# Patient Record
Sex: Female | Born: 1940
Health system: Southern US, Community
[De-identification: ages and names within clinical notes are randomized; demographics above are authoritative.]

## PROBLEM LIST (undated history)

## (undated) DIAGNOSIS — I2510 Atherosclerotic heart disease of native coronary artery without angina pectoris: Secondary | ICD-10-CM

## (undated) DIAGNOSIS — K219 Gastro-esophageal reflux disease without esophagitis: Secondary | ICD-10-CM

## (undated) DIAGNOSIS — I10 Essential (primary) hypertension: Secondary | ICD-10-CM

## (undated) DIAGNOSIS — E7800 Pure hypercholesterolemia, unspecified: Secondary | ICD-10-CM

## (undated) DIAGNOSIS — E785 Hyperlipidemia, unspecified: Secondary | ICD-10-CM

## (undated) HISTORY — PX: NISSEN FUNDOPLICATION: SHX2091

## (undated) HISTORY — PX: CARPAL TUNNEL RELEASE: SHX101

## (undated) HISTORY — DX: Pure hypercholesterolemia, unspecified: E78.00

## (undated) HISTORY — PX: TONSILLECTOMY: SUR1361

## (undated) HISTORY — DX: Gastro-esophageal reflux disease without esophagitis: K21.9

## (undated) HISTORY — DX: Hyperlipidemia, unspecified: E78.5

## (undated) HISTORY — DX: Essential (primary) hypertension: I10

## (undated) HISTORY — PX: ESOPHAGOGASTRODUODENOSCOPY: SHX1529

## (undated) HISTORY — DX: Atherosclerotic heart disease of native coronary artery without angina pectoris: I25.10

---

## 1962-01-30 HISTORY — PX: TYMPANOPLASTY: SHX33

## 1970-01-30 HISTORY — PX: TUBAL LIGATION: SHX77

## 1977-01-30 HISTORY — PX: ABDOMINAL HYSTERECTOMY: SHX81

## 1997-07-31 ENCOUNTER — Ambulatory Visit (HOSPITAL_COMMUNITY): Admission: RE | Admit: 1997-07-31 | Discharge: 1997-07-31 | Payer: Self-pay | Admitting: Cardiology

## 1997-08-25 ENCOUNTER — Ambulatory Visit (HOSPITAL_COMMUNITY): Admission: RE | Admit: 1997-08-25 | Discharge: 1997-08-25 | Payer: Self-pay | Admitting: Gastroenterology

## 1997-09-08 ENCOUNTER — Ambulatory Visit (HOSPITAL_COMMUNITY): Admission: RE | Admit: 1997-09-08 | Discharge: 1997-09-08 | Payer: Self-pay | Admitting: Gastroenterology

## 1997-10-01 ENCOUNTER — Ambulatory Visit (HOSPITAL_COMMUNITY): Admission: RE | Admit: 1997-10-01 | Discharge: 1997-10-01 | Payer: Self-pay | Admitting: Surgery

## 1997-10-20 ENCOUNTER — Encounter: Payer: Self-pay | Admitting: Surgery

## 1997-10-22 ENCOUNTER — Encounter: Payer: Self-pay | Admitting: Gastroenterology

## 1997-10-22 ENCOUNTER — Inpatient Hospital Stay (HOSPITAL_COMMUNITY): Admission: AD | Admit: 1997-10-22 | Discharge: 1997-10-30 | Payer: Self-pay | Admitting: Surgery

## 1997-10-26 ENCOUNTER — Encounter: Payer: Self-pay | Admitting: Surgery

## 1999-04-20 ENCOUNTER — Ambulatory Visit (HOSPITAL_COMMUNITY): Admission: RE | Admit: 1999-04-20 | Discharge: 1999-04-20 | Payer: Self-pay | Admitting: Gastroenterology

## 2000-12-13 ENCOUNTER — Encounter: Payer: Self-pay | Admitting: Surgery

## 2000-12-13 ENCOUNTER — Ambulatory Visit (HOSPITAL_COMMUNITY): Admission: RE | Admit: 2000-12-13 | Discharge: 2000-12-13 | Payer: Self-pay | Admitting: Surgery

## 2001-01-04 ENCOUNTER — Ambulatory Visit (HOSPITAL_COMMUNITY): Admission: RE | Admit: 2001-01-04 | Discharge: 2001-01-04 | Payer: Self-pay | Admitting: Gastroenterology

## 2001-01-07 ENCOUNTER — Encounter: Payer: Self-pay | Admitting: Surgery

## 2001-01-09 ENCOUNTER — Inpatient Hospital Stay (HOSPITAL_COMMUNITY): Admission: RE | Admit: 2001-01-09 | Discharge: 2001-01-14 | Payer: Self-pay | Admitting: Surgery

## 2001-01-09 ENCOUNTER — Encounter: Payer: Self-pay | Admitting: Surgery

## 2001-01-11 ENCOUNTER — Encounter: Payer: Self-pay | Admitting: Surgery

## 2001-06-11 ENCOUNTER — Ambulatory Visit (HOSPITAL_COMMUNITY): Admission: RE | Admit: 2001-06-11 | Discharge: 2001-06-11 | Payer: Self-pay | Admitting: Pulmonary Disease

## 2002-03-21 ENCOUNTER — Inpatient Hospital Stay (HOSPITAL_COMMUNITY): Admission: EM | Admit: 2002-03-21 | Discharge: 2002-03-25 | Payer: Self-pay | Admitting: Emergency Medicine

## 2002-03-21 ENCOUNTER — Encounter: Payer: Self-pay | Admitting: *Deleted

## 2002-03-21 ENCOUNTER — Encounter: Payer: Self-pay | Admitting: Orthopaedic Surgery

## 2002-03-22 ENCOUNTER — Encounter: Payer: Self-pay | Admitting: Orthopaedic Surgery

## 2004-05-13 ENCOUNTER — Encounter: Admission: RE | Admit: 2004-05-13 | Discharge: 2004-05-13 | Payer: Self-pay | Admitting: Gastroenterology

## 2004-08-31 HISTORY — PX: COLONOSCOPY: SHX174

## 2004-08-31 HISTORY — PX: OTHER SURGICAL HISTORY: SHX169

## 2004-12-01 ENCOUNTER — Ambulatory Visit (HOSPITAL_COMMUNITY): Admission: RE | Admit: 2004-12-01 | Discharge: 2004-12-01 | Payer: Self-pay | Admitting: Orthopaedic Surgery

## 2005-10-30 ENCOUNTER — Encounter: Admission: RE | Admit: 2005-10-30 | Discharge: 2005-10-30 | Payer: Self-pay | Admitting: Gastroenterology

## 2005-12-05 ENCOUNTER — Emergency Department (HOSPITAL_COMMUNITY): Admission: EM | Admit: 2005-12-05 | Discharge: 2005-12-05 | Payer: Self-pay | Admitting: Emergency Medicine

## 2007-02-18 ENCOUNTER — Ambulatory Visit: Payer: Self-pay | Admitting: Family Medicine

## 2007-02-18 DIAGNOSIS — M129 Arthropathy, unspecified: Secondary | ICD-10-CM | POA: Insufficient documentation

## 2007-02-18 DIAGNOSIS — R5381 Other malaise: Secondary | ICD-10-CM | POA: Insufficient documentation

## 2007-02-18 DIAGNOSIS — K279 Peptic ulcer, site unspecified, unspecified as acute or chronic, without hemorrhage or perforation: Secondary | ICD-10-CM | POA: Insufficient documentation

## 2007-02-18 DIAGNOSIS — M5030 Other cervical disc degeneration, unspecified cervical region: Secondary | ICD-10-CM

## 2007-02-18 DIAGNOSIS — E785 Hyperlipidemia, unspecified: Secondary | ICD-10-CM

## 2007-02-18 DIAGNOSIS — K589 Irritable bowel syndrome without diarrhea: Secondary | ICD-10-CM

## 2007-02-18 DIAGNOSIS — K219 Gastro-esophageal reflux disease without esophagitis: Secondary | ICD-10-CM | POA: Insufficient documentation

## 2007-02-18 DIAGNOSIS — R5383 Other fatigue: Secondary | ICD-10-CM

## 2007-02-20 ENCOUNTER — Encounter (INDEPENDENT_AMBULATORY_CARE_PROVIDER_SITE_OTHER): Payer: Self-pay | Admitting: Family Medicine

## 2007-02-21 ENCOUNTER — Telehealth (INDEPENDENT_AMBULATORY_CARE_PROVIDER_SITE_OTHER): Payer: Self-pay | Admitting: *Deleted

## 2007-02-21 LAB — CONVERTED CEMR LAB: TSH: 2.445 microintl units/mL (ref 0.350–5.50)

## 2007-02-28 ENCOUNTER — Encounter (INDEPENDENT_AMBULATORY_CARE_PROVIDER_SITE_OTHER): Payer: Self-pay | Admitting: Family Medicine

## 2007-03-04 ENCOUNTER — Ambulatory Visit: Payer: Self-pay | Admitting: Family Medicine

## 2007-03-04 ENCOUNTER — Telehealth (INDEPENDENT_AMBULATORY_CARE_PROVIDER_SITE_OTHER): Payer: Self-pay | Admitting: *Deleted

## 2007-03-04 LAB — CONVERTED CEMR LAB
Cholesterol, target level: 200 mg/dL
HDL goal, serum: 40 mg/dL
LDL Goal: 160 mg/dL

## 2007-03-28 ENCOUNTER — Telehealth (INDEPENDENT_AMBULATORY_CARE_PROVIDER_SITE_OTHER): Payer: Self-pay | Admitting: *Deleted

## 2007-03-28 ENCOUNTER — Encounter (INDEPENDENT_AMBULATORY_CARE_PROVIDER_SITE_OTHER): Payer: Self-pay | Admitting: Family Medicine

## 2007-04-09 ENCOUNTER — Ambulatory Visit: Payer: Self-pay | Admitting: Family Medicine

## 2007-04-14 ENCOUNTER — Ambulatory Visit: Admission: RE | Admit: 2007-04-14 | Discharge: 2007-04-14 | Payer: Self-pay | Admitting: Family Medicine

## 2007-04-14 ENCOUNTER — Encounter (INDEPENDENT_AMBULATORY_CARE_PROVIDER_SITE_OTHER): Payer: Self-pay | Admitting: Family Medicine

## 2007-04-22 ENCOUNTER — Encounter (INDEPENDENT_AMBULATORY_CARE_PROVIDER_SITE_OTHER): Payer: Self-pay | Admitting: Family Medicine

## 2007-04-23 ENCOUNTER — Encounter (INDEPENDENT_AMBULATORY_CARE_PROVIDER_SITE_OTHER): Payer: Self-pay | Admitting: Family Medicine

## 2007-04-24 ENCOUNTER — Encounter (INDEPENDENT_AMBULATORY_CARE_PROVIDER_SITE_OTHER): Payer: Self-pay | Admitting: Family Medicine

## 2007-04-25 ENCOUNTER — Ambulatory Visit: Payer: Self-pay | Admitting: Pulmonary Disease

## 2007-05-07 ENCOUNTER — Telehealth (INDEPENDENT_AMBULATORY_CARE_PROVIDER_SITE_OTHER): Payer: Self-pay | Admitting: Family Medicine

## 2007-05-07 ENCOUNTER — Ambulatory Visit: Payer: Self-pay | Admitting: Family Medicine

## 2007-05-09 ENCOUNTER — Telehealth (INDEPENDENT_AMBULATORY_CARE_PROVIDER_SITE_OTHER): Payer: Self-pay | Admitting: *Deleted

## 2007-05-14 ENCOUNTER — Encounter (INDEPENDENT_AMBULATORY_CARE_PROVIDER_SITE_OTHER): Payer: Self-pay | Admitting: Family Medicine

## 2007-05-15 LAB — CONVERTED CEMR LAB
Cholesterol: 286 mg/dL — ABNORMAL HIGH (ref 0–200)
Triglycerides: 155 mg/dL — ABNORMAL HIGH (ref <150)

## 2007-05-23 ENCOUNTER — Ambulatory Visit: Payer: Self-pay | Admitting: Family Medicine

## 2007-05-24 ENCOUNTER — Encounter (INDEPENDENT_AMBULATORY_CARE_PROVIDER_SITE_OTHER): Payer: Self-pay | Admitting: Family Medicine

## 2007-05-28 ENCOUNTER — Telehealth (INDEPENDENT_AMBULATORY_CARE_PROVIDER_SITE_OTHER): Payer: Self-pay | Admitting: *Deleted

## 2007-05-31 ENCOUNTER — Telehealth (INDEPENDENT_AMBULATORY_CARE_PROVIDER_SITE_OTHER): Payer: Self-pay | Admitting: *Deleted

## 2007-06-03 ENCOUNTER — Encounter (INDEPENDENT_AMBULATORY_CARE_PROVIDER_SITE_OTHER): Payer: Self-pay | Admitting: Family Medicine

## 2007-07-04 ENCOUNTER — Ambulatory Visit: Payer: Self-pay | Admitting: Family Medicine

## 2007-07-04 DIAGNOSIS — IMO0002 Reserved for concepts with insufficient information to code with codable children: Secondary | ICD-10-CM | POA: Insufficient documentation

## 2007-07-05 LAB — CONVERTED CEMR LAB
ALT: 16 units/L (ref 0–35)
AST: 21 units/L (ref 0–37)
Alkaline Phosphatase: 72 units/L (ref 39–117)
Bilirubin, Direct: 0.1 mg/dL (ref 0.0–0.3)
Indirect Bilirubin: 0.4 mg/dL (ref 0.0–0.9)

## 2007-07-08 ENCOUNTER — Telehealth (INDEPENDENT_AMBULATORY_CARE_PROVIDER_SITE_OTHER): Payer: Self-pay | Admitting: Family Medicine

## 2007-07-09 ENCOUNTER — Telehealth (INDEPENDENT_AMBULATORY_CARE_PROVIDER_SITE_OTHER): Payer: Self-pay | Admitting: Family Medicine

## 2007-07-09 ENCOUNTER — Encounter (INDEPENDENT_AMBULATORY_CARE_PROVIDER_SITE_OTHER): Payer: Self-pay | Admitting: Family Medicine

## 2007-07-17 ENCOUNTER — Ambulatory Visit (HOSPITAL_COMMUNITY): Admission: RE | Admit: 2007-07-17 | Discharge: 2007-07-17 | Payer: Self-pay | Admitting: Family Medicine

## 2007-07-18 ENCOUNTER — Encounter (INDEPENDENT_AMBULATORY_CARE_PROVIDER_SITE_OTHER): Payer: Self-pay | Admitting: Family Medicine

## 2007-07-25 ENCOUNTER — Encounter (INDEPENDENT_AMBULATORY_CARE_PROVIDER_SITE_OTHER): Payer: Self-pay | Admitting: Family Medicine

## 2007-07-26 ENCOUNTER — Encounter (INDEPENDENT_AMBULATORY_CARE_PROVIDER_SITE_OTHER): Payer: Self-pay | Admitting: Family Medicine

## 2007-08-15 ENCOUNTER — Ambulatory Visit: Payer: Self-pay | Admitting: Family Medicine

## 2007-08-16 ENCOUNTER — Encounter (INDEPENDENT_AMBULATORY_CARE_PROVIDER_SITE_OTHER): Payer: Self-pay | Admitting: Family Medicine

## 2007-08-16 LAB — CONVERTED CEMR LAB
Albumin: 5 g/dL (ref 3.5–5.2)
Alkaline Phosphatase: 75 units/L (ref 39–117)
BUN: 10 mg/dL (ref 6–23)
Creatinine, Ser: 0.73 mg/dL (ref 0.40–1.20)
Glucose, Bld: 91 mg/dL (ref 70–99)
HDL: 54 mg/dL (ref >39)
LDL Cholesterol: 140 mg/dL — ABNORMAL HIGH (ref 0–99)
Potassium: 4.1 meq/L (ref 3.5–5.3)
Total Bilirubin: 0.5 mg/dL (ref 0.3–1.2)
Total CHOL/HDL Ratio: 4.3
Triglycerides: 195 mg/dL — ABNORMAL HIGH (ref <150)

## 2007-08-21 ENCOUNTER — Telehealth (INDEPENDENT_AMBULATORY_CARE_PROVIDER_SITE_OTHER): Payer: Self-pay | Admitting: *Deleted

## 2007-09-10 ENCOUNTER — Encounter (INDEPENDENT_AMBULATORY_CARE_PROVIDER_SITE_OTHER): Payer: Self-pay | Admitting: Family Medicine

## 2007-09-10 ENCOUNTER — Encounter (HOSPITAL_COMMUNITY): Admission: RE | Admit: 2007-09-10 | Discharge: 2007-10-10 | Payer: Self-pay | Admitting: Family Medicine

## 2007-09-26 ENCOUNTER — Ambulatory Visit: Payer: Self-pay | Admitting: Family Medicine

## 2007-10-31 ENCOUNTER — Ambulatory Visit: Payer: Self-pay | Admitting: Family Medicine

## 2007-11-07 ENCOUNTER — Telehealth (INDEPENDENT_AMBULATORY_CARE_PROVIDER_SITE_OTHER): Payer: Self-pay | Admitting: *Deleted

## 2007-11-12 ENCOUNTER — Encounter (INDEPENDENT_AMBULATORY_CARE_PROVIDER_SITE_OTHER): Payer: Self-pay | Admitting: Family Medicine

## 2007-11-13 ENCOUNTER — Encounter (INDEPENDENT_AMBULATORY_CARE_PROVIDER_SITE_OTHER): Payer: Self-pay | Admitting: Family Medicine

## 2007-11-26 ENCOUNTER — Encounter (INDEPENDENT_AMBULATORY_CARE_PROVIDER_SITE_OTHER): Payer: Self-pay | Admitting: Family Medicine

## 2007-11-27 ENCOUNTER — Encounter (INDEPENDENT_AMBULATORY_CARE_PROVIDER_SITE_OTHER): Payer: Self-pay | Admitting: Family Medicine

## 2007-12-25 ENCOUNTER — Encounter (INDEPENDENT_AMBULATORY_CARE_PROVIDER_SITE_OTHER): Payer: Self-pay | Admitting: Family Medicine

## 2007-12-30 LAB — CONVERTED CEMR LAB
ALT: 17 units/L (ref 0–35)
Alkaline Phosphatase: 78 units/L (ref 39–117)
CO2: 24 meq/L (ref 19–32)
Cholesterol: 243 mg/dL — ABNORMAL HIGH (ref 0–200)
Creatinine, Ser: 0.8 mg/dL (ref 0.40–1.20)
Total Bilirubin: 0.6 mg/dL (ref 0.3–1.2)
Total CHOL/HDL Ratio: 5.5
Triglycerides: 247 mg/dL — ABNORMAL HIGH (ref <150)
VLDL: 49 mg/dL — ABNORMAL HIGH (ref 0–40)

## 2007-12-31 ENCOUNTER — Ambulatory Visit: Payer: Self-pay | Admitting: Family Medicine

## 2008-06-04 ENCOUNTER — Ambulatory Visit (HOSPITAL_COMMUNITY): Admission: RE | Admit: 2008-06-04 | Discharge: 2008-06-04 | Payer: Self-pay | Admitting: Orthopaedic Surgery

## 2009-06-30 ENCOUNTER — Ambulatory Visit: Payer: Self-pay | Admitting: Vascular Surgery

## 2009-09-08 ENCOUNTER — Ambulatory Visit: Payer: Self-pay | Admitting: Vascular Surgery

## 2010-06-14 NOTE — Assessment & Plan Note (Signed)
OFFICE VISIT   BETSY, ROSELLO  DOB:  January 30, 1941                                       06/30/2009  XBJYN#:[id number]   CHIEF COMPLAINT:  Left knee pain.   HISTORY OF PRESENT ILLNESS:  The patient is a 70 year old female who has  had a left leg pain for approximately a year.  She apparently saw an  orthopedic doctor in [place] 1 year ago had a MRI at that time.  The  MRI is not available for review today that apparently showed a Baker's  cyst.  She stated that the physician said the cyst was small at that  time and did not need further treatment and was treated conservatively  with pain medication management.  She states that the pain continue then  became worse over time.  She states that the pain is worse when she is  sitting still.  It does not change with position and it hurts when  walking but is no different than in the sitting position.  Apparently  this cyst was aspirated by this orthopedic doctor on Jun 02, 2009.  She  developed worsening pain after the local anesthetic wore off.  She was  sent by her primary care physician for an ultrasound at [facility].  This  showed no evidence of DVT but apparently showed a pseudoaneurysm of a  branch artery near the popliteal fossa.  She states today the pain is  worse than it was a year ago.   She states that she has had some slight swelling of the left leg.  She  denies any prior trauma to the left leg.   PAST MEDICAL HISTORY:  Significant for elevated cholesterol.  This is  currently under control and followed by Dr. Barbara Cower.   PAST SURGICAL HISTORY:  She had what sounds like a hiatal hernia repair  by Dr. Wenda Low, hysterectomy, tubal ligation, tonsillectomy and an  open reduction and internal fixation of the right ankle.   FAMILY HISTORY:  Remarkable for her brother who had vascular disease at  age less than 18.   SOCIAL HISTORY:  She is married and has 3 children.  She does not smoke  or consume  alcohol regularly.   REVIEW OF SYSTEMS:  Full 12 point review of systems was performed with  the patient today.  Please see intake referral form for details  regarding this.   MEDICATIONS:  Medications include aspirin 81 mg once a day which she is  currently not taking secondary to stomach upset, simvastatin, Omega-3  fish oil, tramadol p.r.n.   ALLERGIES:  She states allergies to codeine which causes nausea,  vomiting, and itching.   PHYSICAL EXAMINATION:  Blood pressure is 156/79 in the left arm, oxygen  saturation is 98%, heart rate 66 and regular.  HEENT:  Unremarkable.  NECK:  Has 2+ carotid pulses without bruit.  CHEST:  Clear to auscultation.  CARDIAC:  Regular rate and rhythm without murmur.  ABDOMEN:  Soft, nontender, nondistended.  No masses with a well-healed  upper midline abdominal scar.  MUSCULOSKELETAL:  Exam shows no obvious major joint deformities.  She  does have a well-healed scar on the right ankle from a previous ORIF  that is nontender.  NEUROLOGIC:  Exam shows symmetric upper extremity lower extremity motor  strength which is 5/5.  SKIN:  No open  ulcers or rashes.  Extremity exam shows 2+ radial pulses bilaterally.  She has 2+ femoral,  3+ popliteal, 2+ posterior tibial and 2+ dorsalis pedis pulses  bilaterally.  There is trace edema in the left lower extremity compared  to the right.  She does have some mild tenderness on palpation in the  left popliteal fossa.  There is no obvious palpable pulsatile mass.  There is essentially no tenderness in the gastrocnemius area of the left  calf.   She had a repeat duplex ultrasound today which show what appears to be a  small pseudoaneurysm off of a side branch adjacent to the popliteal  artery but not involving any major tibial branches or popliteal  branches.  This is most likely consistent with the pseudoaneurysm seen  on Jun 07, 2009.  At that time it measured 1.7 x 1.1 cm.  It is similar  diameter today.    The duplex ultrasound today was ordered by me and I reviewed the images  real time and discussed these with the patient and interpreted the study  as well.   In summary, Ms. Obi has a small pseudoaneurysm of a branch artery most  likely arising from popliteal artery.  This is fairly small caliber,  less than 2 cm.  I do not believe that this is the origin of all of her  pain as this is a fairly small branch artery and fairly small  pseudoaneurysm.  Her pain symptoms still seem to be orthopedic in nature  primarily.  However, I did discuss with her today that a pseudoaneurysm  does not thrombose in the next month or continues to enlarge, we would  consider doing an arteriogram and possible coil embolization of this  pseudoaneurysm.  I did reassure her today that I think the risk of this  is fairly low in nature.  She is considering whether or not she wants to  see an additional orthopedic surgeon for further evaluation of her left  knee.  She will follow up with [place] in 1 month for repeat duplex  ultrasound.     Janetta Hora. Fields, MD  Electronically Signed   CEF/MEDQ  D:  06/30/2009  T:  07/01/2009  Job:  [id number]

## 2010-06-14 NOTE — Procedures (Signed)
NAME:  Tasha Peters, ROTHSCHILD NO.:  [id number]   MEDICAL RECORD NO.:  [id number]          PATIENT TYPE:  OUT   LOCATION:  SLEEP LAB                     FACILITY:  [facility]   PHYSICIAN:  Barbaraann Share, MD,FCCPDATE OF BIRTH:  1941-01-03   DATE OF STUDY:  04/14/2007                            NOCTURNAL POLYSOMNOGRAM   REFERRING PHYSICIAN:  Franchot Heidelberg, M.D.   INDICATION FOR STUDY:  Hypersomnia with sleep apnea.   EPWORTH SLEEPINESS SCORE:  2.   MEDICATIONS:   SLEEP ARCHITECTURE:  The patient had total sleep time of 385 minutes  with adequate slow wave sleep but decreased REM.  Sleep onset latency  was normal as was REM onset.  Sleep efficiency was excellent at 94%.   RESPIRATORY DATA:  The patient was found to have 1 obstructive apnea and  22 hypopneas, 4 in apnea/hypopnea index, only 4 events per hour.  The  patient was also found to have 9 RERA's for a respiratory disturbance  index of 5 events per hour.  Very loud snoring was noted throughout.  The events were not positional in nature.   OXYGEN DATA:  There were O2 desaturation as low as 87% with the  patient's obstructive events.   CARDIAC DATA:  No clinically significant arrhythmias were noted.   MOVEMENT-PARASOMNIA:  None.   IMPRESSIONS-RECOMMENDATIONS:  1. Small numbers of obstructive events which do not meet the      apnea/hypopnea index criteria for the obstructive sleep apnea      syndrome.  The patient was noted to have RERA's with a respiratory      disturbance index of 5 events per hour.  This is consistent with      very mild obstructive sleep apnea, and there was O2 desaturation as      low as 87% transiently.  Treatment for this degree of sleep apnea      should focus on weight loss alone if applicable,      upper airway surgery, oral appliance, and also CPAP.  2. No clinically significant arrhythmias or leg movements.      Barbaraann Share, MD,FCCP  Diplomate, American Board of Sleep  Medicine  Electronically Signed     [facility]/MEDQ  D:  04/25/2007 16:24:47  T:  04/26/2007 16:10:96  Job:  [id number]

## 2010-06-14 NOTE — Procedures (Signed)
VASCULAR LAB EXAM   INDICATION:  Followup pseudoaneurysm in left popliteal fossa visualized  06/30/2009.   HISTORY:  Diabetes:  No.  Cardiac:  No.  Hypertension:  No.   EXAM:  Left popliteal fossa duplex.   IMPRESSION:  1. Arteries and veins of the left popliteal fossa were imaged and show      no evidence of pseudoaneurysm or AVF.  2. Popliteal artery and vein appear within normal limits.  3. Left gastrocnemius artery and vein in popliteal fossa appear within      normal limits.   ___________________________________________  Janetta Hora Fields, MD   AS/MEDQ  D:  09/09/2009  T:  09/09/2009  Job:  [id number]

## 2010-06-14 NOTE — Assessment & Plan Note (Signed)
OFFICE VISIT   MEKIA, DIPINTO  DOB:  [birth year]                                       09/08/2009  CHART#:[id number]   The patient returns for follow-up today.  She was last seen on June 30, 2009.  She was evaluated at that time for small pseudoaneurysm seen on  MRI scan in her popliteal artery.  She denies any symptoms of  claudication or rest pain in her left leg.  Overall, she has been doing  well.  She had a knee arthroscopy performed and states that she has  gotten some relief from this but her left leg is not completely healed  yet from a pain standpoint.   PHYSICAL EXAMINATION:  Today blood pressure is 129/76 in the left arm,  heart rate 69 and regular, temperature is 98.5.  Lower extremity pulse  exam:  She has 2+ femoral, popliteal, dorsalis pedis and posterior  tibial pulses bilaterally.  There is no fullness in popliteal space.   REVIEW OF SYSTEMS:  She denies any shortness of breath or chest pain.    She had a duplex ultrasound of her popliteal artery today which shows no  evidence of pseudoaneurysm and the artery and vein were normal on exam.   I reassured the patient today that if there was a small pseudoaneurysm  in the popliteal artery it has either healed or is so small it is not of  clinical significance.  I believe the best option at this point would be  for her to follow up on an as-needed basis.  No further intervention is  required.     Janetta Hora. Fields, MD  Electronically Signed   CEF/MEDQ  D:  09/08/2009  T:  09/09/2009  Job:  [id number]   cc:   Art Buff, MD  Mila Homer. Sherlean Foot, M.D.

## 2010-06-14 NOTE — Procedures (Signed)
VASCULAR LAB EXAM   INDICATION:  Follow up incidental finding of possible pseudoaneurysm  with pain.   HISTORY:  Diabetes:  Cardiac:  Hypertension:   EXAM:  Duplex of the popliteal artery.   IMPRESSION:  Evidence of a vascularized heterogenous structure  suggestive of a possible pseudoaneurysm in an accessory branch located  in the popliteal fossa.   ___________________________________________  Janetta Hora. Fields, MD   CB/MEDQ  D:  06/30/2009  T:  06/30/2009  Job:  [id number]

## 2010-06-17 NOTE — H&P (Signed)
[facility]  Patient:    Tasha Peters, Tasha Peters Visit Number: [id number] MRN: [id number]          Service Type: SUR Location: 3W 4782 01 Attending Physician:  Katha Cabal Dictated by:   Thornton Park Daphine Deutscher, M.D. Admit Date:  01/09/2001 Discharge Date: 01/14/2001                           History and Physical  CHIEF COMPLAINT:  Gastroesophageal reflux disease after prior open Nissan fundoplication.  HISTORY OF PRESENT ILLNESS:  Tasha Peters is a 70 year old lady who underwent open Nissan fundoplication in September of 1999.  Nine months later, she fell on her porch, landing on her back and from that point forward, marked the recurrence of gastroesophageal reflux symptoms.  She has tried to manage this with medicine, but it is becoming increasingly refractory and she wanted to go ahead and have redo Nissan fundoplication.  PAST MEDICAL HISTORY:  Positive for prior cervical fusion.  CURRENT MEDICATIONS: 1. Prilosec 20 mg q.d. 2. Flexeril p.r.n. muscle spasms, neck. 3. Darvocet.  ALLERGIES:  She is allergic to CODEINE, which produces nausea and vomiting.  FAMILY HISTORY/REVIEW OF SYSTEMS:  Unremarkable and basically noncontributory.  PHYSICAL EXAMINATION:  VITAL SIGNS:  Height is 63.5 inches, weight is 153 pounds.  Temperature is 99, heart rate 66, respirations 16, and blood pressure 110/70.  HEENT:  Unremarkable.  NECK:  She has no palpable masses.  CHEST:  Clear.  HEART:  Sinus rhythm without murmurs or gallops.  ABDOMEN:  Well-healed upper midline incision without masses.  EXTREMITIES:  Full range of motion.  IMPRESSION:  Recurrent gastroesophageal reflux disease.  PLAN:  Open Nissan fundoplication. Dictated by:   Thornton Park Daphine Deutscher, M.D. Attending Physician:  Katha Cabal DD:  01/28/01 TD:  01/28/01 Job: [id number]

## 2010-06-17 NOTE — Procedures (Signed)
[place]  Patient:    Tasha Peters, Tasha Peters Visit Number: [id number] MRN: [id number]          Service Type: END Location: ENDO Attending Physician:  Orland Mustard Dictated by:   Llana Aliment. Randa Evens, M.D. Proc. Date: 01/04/01 Admit Date:  01/04/2001   CC:         Thornton Park. Daphine Deutscher, M.D.  Kari Baars, M.D.  Darden Palmer., M.D.   Procedure Report  NAME OF PROCEDURE:  Esophagogastroduodenoscopy.  ENDOSCOPIST:  Llana Aliment. Randa Evens, M.D.  MEDICATIONS:  Cetacaine spray, fentanyl 25 mcg and Versed 5 mg IV.  INDICATIONS:  [age]-year-old who had a Nissen fundoplication by Dr. Daphine Deutscher.  Did fairly well initially, but had a fall subsequent to the procedure and since then has had increasing reflux.  This is done in anticipation of a possible redo of her Nissen fundoplication.  DESCRIPTION OF PROCEDURE:  Procedure had been explained to the patient and consent obtained.  With the patient in the left lateral decubitus position the Olympus video endoscope was inserted blindly into the esophagus and advanced under direct visualization.  The stomach was entered, pylorus then passed. The duodenum including the bulb and second portion were seen well; unremarkable.  There were no masses in the duodenum.  The scope was withdrawn.  The duodenal bulb was free of ulceration.  The pyloric channel was normal.  The scope was withdrawn back into the stomach. The fundus and cardia were seen and were normal.  On retroflex view the antrum and body were normal with no ulcerations or masses.  There was a 6-8 cm hiatal hernia with a widely patent GE junction and prereflux into the chest.  The distal and proximal esophagus was endoscopically normal.  The scope was withdrawn.  The patient tolerated the procedure well and was maintained on low-flow oxygen and pulse oximeter throughout the procedure.  ASSESSMENT:  Hiatal hernia with reflux.  PLAN:   The patient will follow up with Dr. Daphine Deutscher as planned. Dictated by:   Llana Aliment. Randa Evens, M.D. Attending Physician:  Orland Mustard DD:  01/04/01 TD:  01/04/01 Job: [id number]

## 2010-06-17 NOTE — Discharge Summary (Signed)
   NAME:  Tasha Peters, Tasha Peters                          ACCOUNT NO.:  [id number]   MEDICAL RECORD NO.:  [id number]                   PATIENT TYPE:  INP   LOCATION:  A336                                 FACILITY:  [facility]   PHYSICIAN:  J. Darreld Mclean, M.D.              DATE OF BIRTH:  1940-09-17   DATE OF ADMISSION:  03/21/2002  DATE OF DISCHARGE:  03/25/2002                                 DISCHARGE SUMMARY   DISCHARGE DIAGNOSES:  1. Trimalleolar fracture of the right ankle.  2. History of reflux disease.  3. Status post fusion of neck x3.   DISPOSITION:  The patient is discharged home.  She is to follow up in the  office on May 04, 2002.  She will undergo x-rays of her right ankle.   DISCHARGE STATUS:  Improved.   PROGNOSIS:  Good.   DISCHARGE MEDICATIONS:  Darvocet-N 100 one tablet q.6h. p.r.n. for pain.   BRIEF HISTORY AND PHYSICAL:  Please refer to the typewritten History and  Physical on the patient's chart.   HOSPITAL COURSE:  The patient is a 70 year old white female who fell at home  and twisted her right ankle and sustained a trimalleolar fracture to the  right ankle with displacement.  The day after admission to this hospital she  underwent open treatment internal fixation of the right ankle fracture under  general anesthetic.  She tolerated the procedure very well.  Postoperatively  she was seen by physical therapy for gait training and nonweightbearing to  the right foot.  At the time of discharge she was able to ambulate some 70  feet with standby assistance.  Postoperatively she remained afebrile in  stable condition.   The patient was discharged home on March 25, 2002.  Home health services  provided as well as hospital equipment.   DISCHARGE INSTRUCTIONS:  1. She will continue ambulation with crutches and walker.  2. No weightbearing right foot.  3. Elevation of the right lower extremity at all times.  4. Ice pack to the ankle for pain and swelling.  5. She  is to call the office through the hospital beeper system if there is     any problem.     Candace Cruise, Doylene Bode, M.D.   BB/MEDQ  D:  04/15/2002  T:  04/15/2002  Job:  [id number]

## 2010-06-17 NOTE — Op Note (Signed)
NAME:  Tasha Peters, Tasha Peters                          ACCOUNT NO.:  [id number]   MEDICAL RECORD NO.:  [id number]                   PATIENT TYPE:  INP   LOCATION:  A336                                 FACILITY:  [facility]   PHYSICIAN:  J. Darreld Mclean, M.D.              DATE OF BIRTH:  08/04/1940   DATE OF PROCEDURE:  03/22/2002  DATE OF DISCHARGE:                                 OPERATIVE REPORT   PREOPERATIVE DIAGNOSIS:  Trimalleolar fracture of the right ankle.   POSTOPERATIVE DIAGNOSIS:  Trimalleolar fracture of the right ankle.   PROCEDURE:  Open treatment and internal fixation of right trimalleolar  fracture of the ankle.   ANESTHESIA:  General.   TOURNIQUET TIME:  27 minutes.   DRAINS:  None.   SPLINT:  A posterior splint applied.   SURGEON:  J. Darreld Mclean, M.D.   ASSISTANT:  Candace Cruise, P.A.   INDICATIONS:  The patient is a 70 year old female who slipped and fell last  night at her home and sustained a dislocation of her ankle.  The  trimalleolar fracture of the ankle was relocated in the ER.  Surgery is  scheduled today.  Risks and imponderables have been discussed with the  patient preoperatively, and she appears to understand and agree to the  procedure as outlined.  I also discussed it with her husband.   DESCRIPTION OF PROCEDURE:  The patient was given general anesthesia and laid  supine on the operating room table.  The tourniquet was placed deflated on  the right upper thigh.  The patient was prepped and draped in the usual  manner.  The leg was elevated and wrapped circumferentially with an Esmarch  bandage, the tourniquet inflated to 300 mmHg.  Esmarch bandage removed.  A  ________ incision was made medially and laterally.  Medially the incision  was made and with careful dissection the medial malleolar fracture was  identified.  On top of the talar dome there was a small little area where  you could see some bruising, but there was no obvious fracture.  The  area  was cleaned of hematoma and the fracture was anatomically reduced, held in  place with a Synthes clamp.  A 0.062 smooth Kirschner wire was placed and  then a 3.2 drill bit was used.  A 55 mm long, 4.5 malleolar screw was  inserted.  This gave an anatomical fixation of the fracture.  The Kirschner  wire was cut and bent in place.  Attention was then directed to the lateral  side.  The fracture site was identified, cleaned of hematoma, anatomically  held in place.  A 2.5 mm drill bit was used and then a 3.5 mm tap, a 20 mm  screw was inserted.  This gave a good intrafragmentary compression of the  fracture.  An x-ray was taken in [facility] and lateral and shows anatomic reduction  of all three fractures.  The wound was then reapproximated using 2-0  chromic, 2-0 plain, and skin staples.  A sterile dressing applied, a bulky  dressing applied, and a posterior splint applied.  The tourniquet deflated  after 27 minutes.  The patient tolerated the procedure well, went to  recovery in good condition.  Orders have been written.                                               Teola Bradley, M.D.    JWK/MEDQ  D:  03/22/2002  T:  03/22/2002  Job:  [id number]   cc:   Ramon Dredge L. Juanetta Gosling, M.D.  [address]  [place]  [place] [address]  Fax: [phone]

## 2010-06-17 NOTE — Procedures (Signed)
[place]  Patient:    Tasha Peters, Tasha Peters                       MRN: [id number] Proc. Date: 04/20/99 Adm. Date:  [id number] Attending:  Orland Mustard CC:         Thornton Park. Daphine Deutscher, M.D.             Darden Palmer., M.D.             Kari Baars, M.D.                           Procedure Report  PROCEDURE PERFORMED:  Esophagogastroduodenoscopy.  ENDOSCOPIST:  Llana Aliment. Randa Evens, M.D.  MEDICATIONS USED:  Hurricaine spray, fentanyl 70 mcg, Versed 7 mg IV.  INDICATIONS:  A nice 70 year old woman who had a previous antireflux operation y Dr. Daphine Deutscher and did very well until she fell.  After she fell, there was a tearing sensation and after that she began to have reflux again.  Upper GI revealed what appeared to be a sliding of the wrap.  The procedure was done to evaluate this further.  DESCRIPTION OF PROCEDURE:  The procedure had been explained to the patient and consent obtained.  With the patient in the left lateral decubitus position the Olympus video endoscope was inserted blindly into the esophagus and advanced under direct visualization. The stomach was entered and the pylorus identified and passed.  The duodenum including the bulb and second portion were seen well. The scope was withdrawn back to the stomach and gastric antrum and body were seen well and were normal.  The fundus and cardia were seen on retroflex view.  There was  obvious twisting of the mucosal folds probably due to the wrap.  The diaphragm as located at 38 cm.  Appeared to be the Z-line at 33 to 34 cm.  I suspect the wrap had slipped up into the chest.  It was somewhat difficult to tell.  The distal nd proximal esophagus were seen well on withdrawal and were basically normal.  The  patient tolerated the procedure well maintained on low-flow oxygen and pulse oximetry throughout the procedure with no obvious problem.  ASSESSMENT:  Hiatal hernia with  possible slip in the wrap with signs of esophageal reflux disease.  There is irrigation right at the GE junction.  Have the patient follow up with Dr. Daphine Deutscher to consider possible surgical options. DD:  04/20/99 TD:  04/20/99 Job: [id number]

## 2010-06-17 NOTE — H&P (Signed)
   NAME:  Tasha Peters, Tasha Peters                            ACCOUNT NO.:  [id number]   MEDICAL RECORD NO.:  [id number]                    PATIENT TYPE:   LOCATION:                                       FACILITY:   PHYSICIAN:  J. Darreld Mclean, M.D.              DATE OF BIRTH:   DATE OF ADMISSION:  DATE OF DISCHARGE:                                HISTORY & PHYSICAL   HISTORY OF PRESENT ILLNESS:  The patient is a 70 year old female who was in  her yard who slipped and fell on the wet ground and twisted her right ankle.  She has a displaced trimalleolar fracture of the right ankle. Emergency room  physician tried to reduce it but it is not reduced. It is a little better  aligned. No other injuries were sustained. No loss of consciousness. The  patient takes an occasional Aleve.   ALLERGIES:  CODEINE.   PAST SURGICAL HISTORY:  1. Three neck fusions.  2. Surgeries for reflux, both open.  3. Hysterectomy.  4. Hemorrhoidectomy.   PAST MEDICAL HISTORY:  The patient denies heart attack, CNS stroke problems.  Has had reflux but she hopes that the recent surgery has helped that. She  has chronic neck pain, helped by three fusions. Aleve takes care of most of  that pain. Denies any GU problems. Denies any previous fractures.   SOCIAL HISTORY:  The patient is married. Lives here in [place]. Her  husband accompanies her and is present here in the emergency room.   PHYSICAL EXAMINATION:  VITAL SIGNS: Within normal limits.  HEENT: Negative.  GENERAL: She is alert and cooperative.  NECK: Supple.  LUNGS: Clear.  HEART: Without murmur, rub, or gallop.  ABDOMEN: Soft, tender and without masses.  EXTREMITIES: Deformity of the right ankle.  NEURO:  Intact.  SKIN: Intact.   IMPRESSION:  Trimalleolar fracture and dislocation of right ankle.   PROCEDURE:  Performed in the emergency room. After giving the patient IV  Morphine, closed reduction was carried out and reduced to dislocation of her  ankle  with posterior splint.   PLAN:  She will need OTIF of the ankle tomorrow. I explained the risks and  the problems prior to giving Morphine to both her and her husband. This  include infection, pulmonary embolism which could lead to death, and the  need to stay off of it and elevate as well as traumatic arthritis. I have  recommended spinal anesthesia.                                                Teola Bradley, M.D.    JWK/MEDQ  D:  03/21/2002  T:  03/21/2002  Job:  [id number]

## 2010-06-17 NOTE — Discharge Summary (Signed)
[facility]  Patient:    LYNDSEE, CASA Visit Number: [id number] MRN: [id number]          Service Type: SUR Location: 3W [id number] [id number] Attending Physician:  Katha Cabal Dictated by:   Thornton Park Daphine Deutscher, M.D. Admit Date:  01/09/2001 Discharge Date: 01/14/2001                             Discharge Summary  ADMISSION DIAGNOSIS:  Recurrent reflux.  PROCEDURE:  January 09, 2001, open repair of hiatal hernia with redo Nissen fundoplication.  Repair of ventral hernia.  HOSPITAL COURSE:  This patient underwent the aforementioned operation and had a fairly benign postoperative course.  On the day following her surgery, she was slowly getting up and moving around.  Her pulse rate was up a bit and a Gastrografin swallow was obtained on December 13 which showed good wrap and no evidence of extravasation.  Over the weekend, she did well and she was taking a full liquid diet.  She was ready for discharge on January 14, 2001.  A prescription was written for Darvocet-N 100 to take for pain.  She was asked to return in five days to have her staples removed.  Her incision was bland. Condition was good.  FINAL DIAGNOSIS:  Status post repair of traumatic failure of previous Nissen fundoplication. Dictated by:   Thornton Park Daphine Deutscher, M.D. Attending Physician:  Katha Cabal DD:  01/14/01 TD:  01/14/01 Job: [id number]

## 2010-06-17 NOTE — Op Note (Signed)
[facility]  Patient:    Tasha Peters, Tasha Peters Visit Number: [id number] MRN: [id number]          Service Type: Attending:  Thornton Park. Daphine Deutscher, M.D. Dictated by:   Thornton Park Daphine Deutscher, M.D. Proc. Date: 01/09/01   CC:         Kari Baars, M.D.  James L. Randa Evens, M.D.   Operative Report  PREOPERATIVE DIAGNOSIS:  Traumatic failure of Nissen fundoplication.  POSTOPERATIVE DIAGNOSIS:  Recurrent hiatal hernia with Nissen fundoplication in chest.  OPERATION/PROCEDURE:  Laparotomy and take down of previous Nissen fundoplication and herniated wrap, repair of hiatal hernia, redo Nissen fundoplication, repair of ventral hernia.  SURGEON:  Thornton Park. Daphine Deutscher, M.D.  ASSISTANT:  Ardelle Park  OPERATIVE TIME:  4 hours.  DESCRIPTION OF PROCEDURE:  Tasha Peters was taken to room 1 and after prepping with Betadine and draping sterilely we first made a small longitudinal incision above the umbilicus and inserted the Hasson cannula.  With the angle scope I found her upper midline was totally socked in with adhesions and I would be unable to do anything with the laparoscope.  I then opened up the upper midline incision and entered the abdomen without difficulty.  I took down many adhesions to the anterior abdominal wall. This part of the operation went fairly rapidly.  We then began mobilizing the left lateral segment of the liver; and after doing this, we concentrated on trying to free up the wrap.  The wrap and the titanium clips on the sutures were fused to the diaphragm where it subsequently was found that the wrap was up above the diaphragm.  This was the tedious part of the dissection and took about an hour to completely free up the previously wrapped stomach and then identify the upper esophagus.  We were, however, able to do that.  We identified the posterior vagus and it was intact.  The anterior vagus was not disturbed and was felt to be intact.  I  then set about taking down the wrap itself and this was somewhat tedious. The patient had a previous right lateral perforation from a Maloney dilator and I could still see the tiny little Prolene sutures used to close this.  The closure was intact and we did not create any enterotomies.  We just took our time and then prevailed in this tedious dissection.  I then was able to get the entire wrap back around on the left side.  A Penrose drain had been placed and this was used to facilitate that.  Next, with the upper esophagogastric junction having been completely dissected, we then repaired the hiatus using 3 sutures of #1 Ethibond.  These were placed and this significantly closed the diaphragm posteriorly.  With an NG tube in the stomach, I then did not want to pass another dilator down because of the previous perforation.  I went up high on the esophagus in the abdomen and placed four sutures through the wrap portion of the stomach, the esophagus and the left anterior portion to completely invaginate the distal esophagus.  There was plenty of room underneath it so I think it was a floppy Nissen and these were tied down approximating the tissue nicely.  When completed, this area was irrigated.  There was no bleeding noted.  The liver was then allowed to return to its anatomic position.  It had been retracted using the [facility] retractor.  Next, we took out the hernia sac in the previous midline incision.  We irrigated that with saline.  We then closed the fascia with running #1 Prolene from above and below with interrupted #1 Novofils.  This created a nice closure of the fascia.  The skin was then closed with the stapler.  The patient seemed to tolerate this procedure well.  She was sent to the intensive care unit for observation postoperatively. Dictated by:   Thornton Park Daphine Deutscher, M.D. Attending:  Thornton Park. Daphine Deutscher, M.D. DD:  01/09/01 TD:  01/10/01 Job: [id number]

## 2010-12-21 ENCOUNTER — Other Ambulatory Visit: Payer: Self-pay | Admitting: Gastroenterology

## 2010-12-27 ENCOUNTER — Ambulatory Visit
Admission: RE | Admit: 2010-12-27 | Discharge: 2010-12-27 | Disposition: A | Discharge status: Home / Self Care | Payer: Medicare Other | Source: Ambulatory Visit | Attending: Gastroenterology | Admitting: Gastroenterology

## 2010-12-27 MED ORDER — IOHEXOL 300 MG/ML  SOLN
100.0000 mL | Freq: Once | INTRAMUSCULAR | Status: AC | PRN
  Administered 2010-12-27: 100 mL via INTRAVENOUS

## 2011-06-05 ENCOUNTER — Other Ambulatory Visit: Payer: Self-pay | Admitting: Gastroenterology

## 2011-06-06 ENCOUNTER — Encounter (INDEPENDENT_AMBULATORY_CARE_PROVIDER_SITE_OTHER): Payer: Self-pay | Admitting: Surgery

## 2011-06-06 ENCOUNTER — Ambulatory Visit
Admission: RE | Admit: 2011-06-06 | Discharge: 2011-06-06 | Disposition: A | Discharge status: Home / Self Care | Payer: Medicare Other | Source: Ambulatory Visit | Attending: Gastroenterology | Admitting: Gastroenterology

## 2012-05-26 ENCOUNTER — Emergency Department (HOSPITAL_COMMUNITY)
Admission: EM | Admit: 2012-05-26 | Discharge: 2012-05-26 | Disposition: A | Discharge status: Home / Self Care | Payer: Medicare Other | Attending: Emergency Medicine | Admitting: Emergency Medicine

## 2012-05-26 ENCOUNTER — Emergency Department (HOSPITAL_COMMUNITY): Payer: Medicare Other

## 2012-05-26 ENCOUNTER — Encounter (HOSPITAL_COMMUNITY): Payer: Self-pay | Admitting: *Deleted

## 2012-05-26 DIAGNOSIS — M545 Low back pain, unspecified: Secondary | ICD-10-CM | POA: Insufficient documentation

## 2012-05-26 DIAGNOSIS — R10A0 Flank pain, unspecified side: Secondary | ICD-10-CM

## 2012-05-26 DIAGNOSIS — Z7982 Long term (current) use of aspirin: Secondary | ICD-10-CM | POA: Insufficient documentation

## 2012-05-26 DIAGNOSIS — R109 Unspecified abdominal pain: Secondary | ICD-10-CM

## 2012-05-26 DIAGNOSIS — K219 Gastro-esophageal reflux disease without esophagitis: Secondary | ICD-10-CM | POA: Insufficient documentation

## 2012-05-26 DIAGNOSIS — Z79899 Other long term (current) drug therapy: Secondary | ICD-10-CM | POA: Insufficient documentation

## 2012-05-26 DIAGNOSIS — R319 Hematuria, unspecified: Secondary | ICD-10-CM

## 2012-05-26 DIAGNOSIS — E7800 Pure hypercholesterolemia, unspecified: Secondary | ICD-10-CM | POA: Insufficient documentation

## 2012-05-26 DIAGNOSIS — I10 Essential (primary) hypertension: Secondary | ICD-10-CM | POA: Insufficient documentation

## 2012-05-26 DIAGNOSIS — R630 Anorexia: Secondary | ICD-10-CM | POA: Insufficient documentation

## 2012-05-26 LAB — CBC WITH DIFFERENTIAL/PLATELET
Basophils Absolute: 0.1 10*3/uL (ref 0.0–0.1)
Eosinophils Absolute: 0.4 10*3/uL (ref 0.0–0.7)
Eosinophils Relative: 6 % — ABNORMAL HIGH (ref 0–5)
MCH: 31.1 pg (ref 26.0–34.0)
MCHC: 35.1 g/dL (ref 30.0–36.0)
MCV: 88.7 fL (ref 78.0–100.0)
Platelets: 256 10*3/uL (ref 150–400)
RDW: 12.8 % (ref 11.5–15.5)

## 2012-05-26 LAB — BASIC METABOLIC PANEL
Calcium: 9.3 mg/dL (ref 8.4–10.5)
GFR calc non Af Amer: 86 mL/min — ABNORMAL LOW (ref >90)
Glucose, Bld: 116 mg/dL — ABNORMAL HIGH (ref 70–99)
Sodium: 141 mEq/L (ref 135–145)

## 2012-05-26 LAB — URINALYSIS, ROUTINE W REFLEX MICROSCOPIC
Bilirubin Urine: NEGATIVE
Protein, ur: NEGATIVE mg/dL
Urobilinogen, UA: 0.2 mg/dL (ref 0.0–1.0)

## 2012-05-26 LAB — URINE MICROSCOPIC-ADD ON

## 2012-05-26 MED ORDER — IOHEXOL 300 MG/ML  SOLN
50.0000 mL | Freq: Once | INTRAMUSCULAR | Status: AC | PRN
  Administered 2012-05-26: 50 mL via ORAL

## 2012-05-26 MED ORDER — ONDANSETRON HCL 4 MG/2ML IJ SOLN
4.0000 mg | Freq: Once | INTRAMUSCULAR | Status: AC
  Administered 2012-05-26: 4 mg via INTRAVENOUS
  Filled 2012-05-26: qty 2

## 2012-05-26 MED ORDER — IOHEXOL 300 MG/ML  SOLN
100.0000 mL | Freq: Once | INTRAMUSCULAR | Status: AC | PRN
  Administered 2012-05-26: 100 mL via INTRAVENOUS

## 2012-05-26 MED ORDER — SODIUM CHLORIDE 0.9 % IV BOLUS (SEPSIS)
250.0000 mL | Freq: Once | INTRAVENOUS | Status: AC
  Administered 2012-05-26: 250 mL via INTRAVENOUS

## 2012-05-26 MED ORDER — SODIUM CHLORIDE 0.9 % IV SOLN: INTRAVENOUS | Status: DC

## 2012-05-26 NOTE — ED Provider Notes (Signed)
History  This chart was scribed for Tasha Jakes, MD by Ardelia Mems, ED Scribe. This patient was seen in room APA19/APA19 and the patient's care was started at 7:14 AM.    CSN: [id number]  Arrival date & time 05/26/12  0706     Chief Complaint  Patient presents with  . Abdominal Pain    Patient is a 72 y.o. female presenting with abdominal pain. The history is provided by the patient. No language interpreter was used.  Abdominal Pain Pain location:  LLQ and RLQ Pain radiates to:  Back Pain severity:  Moderate Duration:  4 days Timing:  Constant Progression:  Unchanged Chronicity:  New Relieved by:  Nothing Ineffective treatments:  None tried Associated symptoms: hematuria   Associated symptoms: no chest pain, no chills, no cough, no diarrhea, no fever, no nausea, no shortness of breath and no vomiting    HPI Comments: TIAHNA CURE is a 72 y.o. female with a h/o HTN who presents to the Emergency Department complaining of bilateral lower back pain that radiates to LLQ and RLQ of abdomen and hematuria onset 4 days ago. Pain is constant and moderate in severity. Pt states that she has noticed blood on her tissue paper when she wipes after urinating. She believes the source of blood is urine and not vaginal or rectal. There is an associated decrease in appetite. Pt denies fever, nausea,  vomiting or any other symptoms. Pt states that she lives alone.  PCP- DR. Dondiego   Past Medical History  Diagnosis Date  . Acid reflux   . Hypertension   . Hypercholesterolemia     Past Surgical History  Procedure Laterality Date  . Tubal ligation  1972  . Abdominal hysterectomy  1979  . Tonsillectomy    . Tympanoplasty  1964  . Colonoscopy  08/31/2004  . Nissen fundoplication  01/09/2001 & 10/21/1997  . Esophagogastroduodenoscopy  01/04/2001, 04/20/1999, 08/25/1997  . Carpal tunnel release  03/2010, 03/2010    both wrists    No family history on file.  History  Substance Use  Topics  . Smoking status: Never Smoker   . Smokeless tobacco: Not on file  . Alcohol Use: No    OB History   Grav Para Term Preterm Abortions TAB SAB Ect Mult Living                  Review of Systems  Constitutional: Positive for appetite change. Negative for fever and chills.  HENT: Negative for congestion and rhinorrhea.   Eyes: Negative for visual disturbance.  Respiratory: Negative for cough and shortness of breath.   Cardiovascular: Negative for chest pain and leg swelling.  Gastrointestinal: Positive for abdominal pain. Negative for nausea, vomiting and diarrhea.  Genitourinary: Positive for hematuria.  Musculoskeletal: Positive for back pain.  Skin: Negative for rash.  Neurological: Negative for light-headedness and headaches.  Hematological: Does not bruise/bleed easily.  Psychiatric/Behavioral: Negative for confusion.    Allergies  Aspirin; Lactose intolerance (gi); and Codeine  Home Medications   Current Outpatient Rx  Name  Route  Sig  Dispense  Refill  . aspirin 81 MG tablet   Oral   Take 81 mg by mouth daily.         . clonazePAM (KLONOPIN) 2 MG tablet   Oral   Take 2 mg by mouth at bedtime.         . hyoscyamine (LEVSIN SL) 0.125 MG SL tablet   Sublingual   Place 0.125 mg  under the tongue every 6 (six) hours as needed for cramping.         . Lactase (LACTAID PO)   Oral   Take 2 tablets by mouth daily as needed.          . Melatonin 3 MG TABS   Oral   Take 1 tablet by mouth at bedtime.         Marland Kitchen omeprazole (PRILOSEC) 20 MG capsule   Oral   Take 20 mg by mouth 2 (two) times daily.         Marland Kitchen PARoxetine (PAXIL) 20 MG tablet   Oral   Take 20 mg by mouth every morning.         . simvastatin (ZOCOR) 40 MG tablet   Oral   Take 40 mg by mouth every evening.         Marland Kitchen lisinopril-hydrochlorothiazide (PRINZIDE,ZESTORETIC) 20-12.5 MG per tablet   Oral   Take 1 tablet by mouth daily.           Triage VItals: P 140/100  Pulse  90  Temp(Src) 97.3 F (36.3 C) (Oral)  Resp 20  Ht 5\' 3"  (1.6 m)  Wt 150 lb (68.04 kg)  BMI 26.58 kg/m2  SpO2 96%  Physical Exam  Constitutional: She is oriented to person, place, and time. She appears well-developed and well-nourished.  HENT:  Head: Normocephalic and atraumatic.  Eyes: Conjunctivae and EOM are normal. Pupils are equal, round, and reactive to light.  Neck: Normal range of motion. Neck supple.  Cardiovascular: Normal rate, regular rhythm and normal heart sounds.   No murmur heard. Pulmonary/Chest: Effort normal and breath sounds normal. No respiratory distress.  Abdominal: Soft. Bowel sounds are normal. There is no tenderness.  Musculoskeletal: Normal range of motion. She exhibits no edema.  No lower extremity edema. Contracted right fifth finger due to past ligament injury.  Neurological: She is alert and oriented to person, place, and time. No cranial nerve deficit.  Skin: Skin is warm and dry. No rash noted.    ED Course  Procedures (including critical care time)  DIAGNOSTIC STUDIES: Oxygen Saturation is 96% on RA, normal by my interpretation.    COORDINATION OF CARE: 7:30 AM- Pt advised of plan for treatment and pt agrees.   Results for orders placed during the hospital encounter of 05/26/12  URINALYSIS, ROUTINE W REFLEX MICROSCOPIC      Result Value Range   Color, Urine YELLOW  YELLOW   APPearance CLEAR  CLEAR   Specific Gravity, Urine 1.020  1.005 - 1.030   pH 6.5  5.0 - 8.0   Glucose, UA NEGATIVE  NEGATIVE mg/dL   Hgb urine dipstick TRACE (*) NEGATIVE   Bilirubin Urine NEGATIVE  NEGATIVE   Ketones, ur NEGATIVE  NEGATIVE mg/dL   Protein, ur NEGATIVE  NEGATIVE mg/dL   Urobilinogen, UA 0.2  0.0 - 1.0 mg/dL   Nitrite NEGATIVE  NEGATIVE   Leukocytes, UA NEGATIVE  NEGATIVE  CBC WITH DIFFERENTIAL      Result Value Range   WBC 5.8  4.0 - 10.5 K/uL   RBC 4.34  3.87 - 5.11 MIL/uL   Hemoglobin 13.5  12.0 - 15.0 g/dL   HCT 16.1  09.6 - 04.5 %   MCV  88.7  78.0 - 100.0 fL   MCH 31.1  26.0 - 34.0 pg   MCHC 35.1  30.0 - 36.0 g/dL   RDW 40.9  81.1 - 91.4 %   Platelets 256  150 -  400 K/uL   Neutrophils Relative 51  43 - 77 %   Neutro Abs 2.9  1.7 - 7.7 K/uL   Lymphocytes Relative 33  12 - 46 %   Lymphs Abs 1.9  0.7 - 4.0 K/uL   Monocytes Relative 9  3 - 12 %   Monocytes Absolute 0.5  0.1 - 1.0 K/uL   Eosinophils Relative 6 (*) 0 - 5 %   Eosinophils Absolute 0.4  0.0 - 0.7 K/uL   Basophils Relative 1  0 - 1 %   Basophils Absolute 0.1  0.0 - 0.1 K/uL  BASIC METABOLIC PANEL      Result Value Range   Sodium 141  135 - 145 mEq/L   Potassium 3.7  3.5 - 5.1 mEq/L   Chloride 106  96 - 112 mEq/L   CO2 24  19 - 32 mEq/L   Glucose, Bld 116 (*) 70 - 99 mg/dL   BUN 14  6 - 23 mg/dL   Creatinine, Ser 1.61  0.50 - 1.10 mg/dL   Calcium 9.3  8.4 - 09.6 mg/dL   GFR calc non Af Amer 86 (*) >90 mL/min   GFR calc Af Amer >90  >90 mL/min  URINE MICROSCOPIC-ADD ON      Result Value Range   Squamous Epithelial / LPF FEW (*) RARE   WBC, UA 0-2  <3 WBC/hpf   RBC / HPF 0-2  <3 RBC/hpf   Bacteria, UA RARE  RARE    Ct Abdomen Pelvis W Contrast  05/26/2012  *RADIOLOGY REPORT*  Clinical Data: 72 year old female with abdominal, flank and pelvic pain with hematuria.  CT ABDOMEN AND PELVIS WITH CONTRAST  Technique:  Multidetector CT imaging of the abdomen and pelvis was performed following the standard protocol during bolus administration of intravenous contrast.  Contrast: 100 ml intravenous Omnipaque-300 12/27/2010 CT  Comparison: 12/27/2010 CT  Findings: Mild fatty infiltration of the liver is noted without focal hepatic abnormalities. The spleen, pancreas, adrenal glands, gallbladder and kidneys are unremarkable. There is no evidence of renal mass or urinary calculi.  No free fluid, enlarged lymph nodes, biliary dilation or abdominal aortic aneurysm identified.  The bowel and bladder are unremarkable. Moderate supraumbilical ventral hernias containing fat are  unchanged. No acute or suspicious bony abnormalities are identified.  IMPRESSION: No evidence of acute abnormality.  Unchanged mild fatty infiltration of the liver and moderate supraumbilical ventral hernias containing fat.   Original Report Authenticated By: Harmon Pier, M.D.      1. Flank pain   2. Hematuria       MDM  No explanation for the patient's hematuria. No hematuria here today. Questionable blood from the vaginal area patient has a GYN Dr. what they can followup with. No leukocytosis no anemia. No renal function abnormality. CT scan completely negative. Patient comfortable in emergency part nontoxic no acute distress.        I personally performed the services described in this documentation, which was scribed in my presence. The recorded information has been reviewed and is accurate.     Tasha Jakes, MD 05/26/12 1130

## 2012-05-26 NOTE — ED Notes (Signed)
Pt c/o lower back and lower abd pain, worse on right vs left side, that started Tuesday, pt also has been noticing blood when she will wipe after urination.

## 2012-05-26 NOTE — ED Notes (Signed)
MD at bedside. 

## 2015-03-25 ENCOUNTER — Other Ambulatory Visit: Payer: Self-pay | Admitting: Orthopaedic Surgery

## 2015-03-25 ENCOUNTER — Telehealth: Payer: Self-pay | Admitting: *Deleted

## 2015-03-25 ENCOUNTER — Ambulatory Visit (INDEPENDENT_AMBULATORY_CARE_PROVIDER_SITE_OTHER): Payer: Medicare Other

## 2015-03-25 ENCOUNTER — Encounter: Payer: Self-pay | Admitting: Orthopaedic Surgery

## 2015-03-25 ENCOUNTER — Ambulatory Visit (INDEPENDENT_AMBULATORY_CARE_PROVIDER_SITE_OTHER): Payer: Medicare Other | Admitting: Orthopaedic Surgery

## 2015-03-25 VITALS — BP 157/82 | HR 74 | Temp 97.7°F | Resp 16 | Ht 63.0 in | Wt 144.0 lb

## 2015-03-25 DIAGNOSIS — M10071 Idiopathic gout, right ankle and foot: Secondary | ICD-10-CM | POA: Diagnosis not present

## 2015-03-25 DIAGNOSIS — M25571 Pain in right ankle and joints of right foot: Secondary | ICD-10-CM | POA: Diagnosis not present

## 2015-03-25 LAB — URIC ACID: Uric Acid, Serum: 6.1 mg/dL (ref 2.4–7.0)

## 2015-03-25 MED ORDER — DICLOFENAC SODIUM 3 % TD GEL: TRANSDERMAL | Status: DC

## 2015-03-25 NOTE — Telephone Encounter (Signed)
Patient called stating on her paper work her medication was called into [facility] in [place], Patient said it needs to be called into [facility] on [address] in [place]. Please advise

## 2015-03-25 NOTE — Progress Notes (Addendum)
Patient [name]:[id number] C [place], female DOB:30-Nov-1940, 75 y.o. [id number]  Chief Complaint  Patient presents with  . Ankle Pain    Right ankle pain    HPI  Tasha Peters is a 75 y.o. female who had sudden onset of pain in the right ankle about three weeks ago with no history of trauma or unusual activity.  She has no fever, no redness.  She has seen her family doctor about his.  He asked she come here.  She had a trimalleolar fracture in 2004 and had surgery by me then.  She has done well with the ankle until now.  She has deep pain at times. It is worse with weight bearing.  She has not had relief with Advil or ice.  Elevating it helps.  Her mother had gout and I will get uric acid level done.  HPI  Body mass index is 25.51 kg/(m^2). Marland Kitchen  Review of Systems  Constitutional: Positive for fatigue.       Patient does not have Diabetes Mellitus. Patient has hypertension. Patient does not have COPD or shortness of breath. Patient does not have BMI > 35. Patient does not have current smoking history.  HENT: Negative for congestion.   Respiratory: Negative for cough and shortness of breath.   Cardiovascular: Negative for chest pain.  Endocrine: Negative for cold intolerance.  Musculoskeletal: Positive for myalgias, joint swelling, arthralgias and gait problem.  Allergic/Immunologic: Negative for environmental allergies.    Past Medical History  Diagnosis Date  . Acid reflux   . Hypertension   . Hypercholesterolemia     Past Surgical History  Procedure Laterality Date  . Tubal ligation  1972  . Abdominal hysterectomy  1979  . Tonsillectomy    . Tympanoplasty  1964  . Colonoscopy  08/31/2004  . Nissen fundoplication  [id number] & 10/21/1997  . Esophagogastroduodenoscopy  01/04/2001, 04/20/1999, 08/25/1997  . Carpal tunnel release  03/2010, 03/2010    both wrists    No family history on file.  Social History Social History  Substance Use Topics  . Smoking status: Never Smoker    . Smokeless tobacco: None  . Alcohol Use: No    Allergies  Allergen Reactions  . Lactose Intolerance (Gi)   . Codeine Itching and Nausea And Vomiting    Current Outpatient Prescriptions  Medication Sig Dispense Refill  . aspirin 81 MG tablet Take 81 mg by mouth daily.    Marland Kitchen lisinopril (PRINIVIL,ZESTRIL) 10 MG tablet Take 10 mg by mouth daily.    Marland Kitchen omeprazole (PRILOSEC) 20 MG capsule Take 20 mg by mouth 2 (two) times daily.    . simvastatin (ZOCOR) 40 MG tablet Take 40 mg by mouth every evening.    . Diclofenac Sodium 3 % GEL Rub into site of pain tid.  4 gram dose. 5 Tube 5   No current facility-administered medications for this visit.     Physical Exam  Blood pressure 157/82, pulse 74, temperature 97.7 F (36.5 C), resp. rate 16, height 5\' 3"  (1.6 m), weight 144 lb (65.318 kg).  Constitutional: overall normal hygiene, normal nutrition, well developed, normal grooming, normal body habitus. Assistive device:none  Musculoskeletal: gait and station Limp right, muscle tone and strength are normal, no tremors or atrophy is present.  .  Neurological: coordination overall normal.  Deep tendon reflex/nerve stretch intact.  Sensation normal.  Cranial nerves II-XII intact.   Skin:   normal overall but well healed ankle medial and lateral scars, lesions, ulcers or rashes.  No psoriasis.  Psychiatric: Alert and oriented x 3.  Recent memory intact, remote memory unclear.  Normal mood and affect. Well groomed.  Good eye contact.  Cardiovascular: overall no swelling, no varicosities, no edema bilaterally, normal temperatures of the legs and arms, no clubbing, cyanosis and good capillary refill.  Lymphatic: palpation is normal.   Extremities:The right ankle has lateral pain and swelling.  There is pain over the anterior talofibular ligament.  No redness is present.  Knees are bilaterally normal.  The left ankle has full ROM.  Well healed medial and lateral malleolar scars are present right  ankle Inspection as above Strength and tone normal both lower extremties Range of motion full of both ankles and knees.  The right ankle is tender in ROM however.  Additional services performed: x-rays of the right ankle.  She was fitted for ankle brace for the right ankle.  Instructions for contrast baths given.  She has elevated blood pressure today.  She will see her family doctor about this.  I have ordered a serum uric acid as she has a family history of gout (her mother).  With the sudden onset I feel this should be checked.   The patient has been educated about the nature of the problem(s) and counseled on treatment options.  The patient appeared to understand what I have discussed and is in agreement with it. Encounter Diagnoses  Name Primary?  . Right ankle pain Yes  . Acute idiopathic gout of right ankle     PLAN Call if any problems.  Precautions discussed.  Continue current medications.   Return to clinic 2 wks

## 2015-03-25 NOTE — Patient Instructions (Signed)
Wear brace Use cream Get labs drawn

## 2015-03-25 NOTE — Telephone Encounter (Signed)
Give verbal over the phone to [facility] in [place] for diclofenac gel as ordered per Dr. Luna Glasgow

## 2015-04-08 ENCOUNTER — Ambulatory Visit (INDEPENDENT_AMBULATORY_CARE_PROVIDER_SITE_OTHER): Payer: Medicare Other | Admitting: Orthopaedic Surgery

## 2015-04-08 ENCOUNTER — Telehealth: Payer: Self-pay | Admitting: Orthopaedic Surgery

## 2015-04-08 ENCOUNTER — Ambulatory Visit (INDEPENDENT_AMBULATORY_CARE_PROVIDER_SITE_OTHER): Payer: Medicare Other

## 2015-04-08 VITALS — BP 154/91 | HR 78 | Temp 97.7°F | Ht 63.0 in | Wt 144.0 lb

## 2015-04-08 DIAGNOSIS — M25571 Pain in right ankle and joints of right foot: Secondary | ICD-10-CM

## 2015-04-08 MED ORDER — ALLOPURINOL 300 MG PO TABS: 300.0000 mg | ORAL_TABLET | Freq: Every day | ORAL | Status: DC

## 2015-04-08 NOTE — Telephone Encounter (Signed)
Patient states that her medications need to be sent to [facility] in [place] on [address]. She stated that Dr. Luna Glasgow was going to send her Gout medicine into the pharmacy.

## 2015-04-08 NOTE — Progress Notes (Signed)
Patient Tasha Peters:[id number] C [place], female DOB:Jul 07, 1940, 75 y.o. [id number]  Chief Complaint  Patient presents with  . Follow-up    Right ankle    HPI  Tasha Peters is a 75 y.o. female who has had right ankle pain.  She had uric acid done which was 6.1.  Although this is in normal range, her mother had gout and I feel she has gout as well.  I will give sample of Uloric and begin allopurinol.  She has less swelling of the ankle after using the ankle brace and the cream rub.  She has no redness today.  She is walking better but still has some swelling at times.  HPI  Body mass index is 25.51 kg/(m^2).   Review of Systems  Constitutional: Positive for fatigue.       Patient does not have Diabetes Mellitus. Patient has hypertension. Patient does not have COPD or shortness of breath. Patient does not have BMI > 35. Patient does not have current smoking history.  Musculoskeletal: Positive for myalgias, joint swelling, arthralgias and gait problem.    Past Medical History  Diagnosis Date  . Acid reflux   . Hypertension   . Hypercholesterolemia     Past Surgical History  Procedure Laterality Date  . Tubal ligation  1972  . Abdominal hysterectomy  1979  . Tonsillectomy    . Tympanoplasty  1964  . Colonoscopy  08/31/2004  . Nissen fundoplication  [id number] & 10/21/1997  . Esophagogastroduodenoscopy  01/04/2001, 04/20/1999, 08/25/1997  . Carpal tunnel release  03/2010, 03/2010    both wrists    No family history on file.  Social History Social History  Substance Use Topics  . Smoking status: Never Smoker   . Smokeless tobacco: Not on file  . Alcohol Use: No    Allergies  Allergen Reactions  . Lactose Intolerance (Gi)   . Codeine Itching and Nausea And Vomiting    Current Outpatient Prescriptions  Medication Sig Dispense Refill  . aspirin 81 MG tablet Take 81 mg by mouth daily.    . Diclofenac Sodium 3 % GEL Rub into site of pain tid.  4 gram dose. 5 Tube 5  . lisinopril  (PRINIVIL,ZESTRIL) 10 MG tablet Take 10 mg by mouth daily.    Marland Kitchen omeprazole (PRILOSEC) 20 MG capsule Take 20 mg by mouth 2 (two) times daily.    . simvastatin (ZOCOR) 40 MG tablet Take 40 mg by mouth every evening.    Marland Kitchen allopurinol (ZYLOPRIM) 300 MG tablet Take 1 tablet (300 mg total) by mouth daily. 30 tablet 5   No current facility-administered medications for this visit.     Physical Exam  Blood pressure 154/91, pulse 78, temperature 97.7 F (36.5 C), height 5\' 3"  (1.6 m), weight 144 lb (65.318 kg).  Constitutional: overall normal hygiene, normal nutrition, well developed, normal grooming, normal body habitus. Assistive device:braces  Musculoskeletal: gait and station Limp slight to right, muscle tone and strength are normal, no tremors or atrophy is present.  .  Neurological: coordination overall normal.  Deep tendon reflex/nerve stretch intact.  Sensation normal.  Cranial nerves II-XII intact.   Skin:   normal overall no scars, lesions, ulcers or rashes. No psoriasis.  Psychiatric: Alert and oriented x 3.  Recent memory intact, remote memory unclear.  Normal mood and affect. Well groomed.  Good eye contact.  Cardiovascular: overall no swelling, no varicosities, no edema bilaterally, normal temperatures of the legs and arms, no clubbing, cyanosis and good capillary refill.  Lymphatic: palpation is normal.   Extremities:the right ankle has some tenderness but no swelling and no redness.   Inspection normal Strength and tone normal Range of motion full of the right ankle but some tenderness.  Additional services performed: samples of Uloric given.  Begin allopurinol.  Rx sent.  X-rays of the right ankle were done and reported separately.  The patient has been educated about the nature of the problem(s) and counseled on treatment options.  The patient appeared to understand what I have discussed and is in agreement with it.  PLAN Call if any problems.  Precautions discussed.   Continue current medications.   Return to clinic 3 weeks.

## 2015-04-14 ENCOUNTER — Other Ambulatory Visit: Payer: Self-pay | Admitting: *Deleted

## 2015-04-14 MED ORDER — ALLOPURINOL 300 MG PO TABS: 300.0000 mg | ORAL_TABLET | Freq: Every day | ORAL | Status: DC

## 2015-04-29 ENCOUNTER — Ambulatory Visit (INDEPENDENT_AMBULATORY_CARE_PROVIDER_SITE_OTHER): Payer: Medicare Other | Admitting: Orthopaedic Surgery

## 2015-04-29 VITALS — BP 150/85 | HR 70 | Temp 97.5°F | Ht 63.0 in | Wt 144.0 lb

## 2015-04-29 DIAGNOSIS — M25571 Pain in right ankle and joints of right foot: Secondary | ICD-10-CM

## 2015-04-29 NOTE — Progress Notes (Signed)
Patient [name]:[id number] C [place], female DOB:1940-09-03, 75 y.o. HN:[id number]  Chief Complaint  Patient presents with  . Follow-up    Right ankle    HPI  Tasha Peters is a 75 y.o. female who is seen in follow-up for ankle pain on the right.  She is doing very well.  She has no pain. She has some slight lateral swelling now and then. She has stopped her ankle brace.  She is wearing normal shoes now.  She has no new trauma. She has no redness.  I will see her as needed.   HPI  Body mass index is 25.51 kg/(m^2).  Review of Systems  Constitutional: Positive for fatigue.       Patient does not have Diabetes Mellitus. Patient has hypertension. Patient does not have COPD or shortness of breath. Patient does not have BMI > 35. Patient does not have current smoking history.  HENT: Negative for congestion.   Respiratory: Negative for cough and shortness of breath.   Cardiovascular: Negative for chest pain.  Musculoskeletal: Positive for myalgias, joint swelling, arthralgias and gait problem.    Past Medical History  Diagnosis Date  . Acid reflux   . Hypertension   . Hypercholesterolemia     Past Surgical History  Procedure Laterality Date  . Tubal ligation  1972  . Abdominal hysterectomy  1979  . Tonsillectomy    . Tympanoplasty  1964  . Colonoscopy  08/31/2004  . Nissen fundoplication  [id number] & 10/21/1997  . Esophagogastroduodenoscopy  01/04/2001, 04/20/1999, 08/25/1997  . Carpal tunnel release  03/2010, 03/2010    both wrists    No family history on file.  Social History Social History  Substance Use Topics  . Smoking status: Never Smoker   . Smokeless tobacco: Not on file  . Alcohol Use: No    Allergies  Allergen Reactions  . Lactose Intolerance (Gi)   . Codeine Itching and Nausea And Vomiting    Current Outpatient Prescriptions  Medication Sig Dispense Refill  . allopurinol (ZYLOPRIM) 300 MG tablet Take 1 tablet (300 mg total) by mouth daily. 30 tablet 5  .  aspirin 81 MG tablet Take 81 mg by mouth daily.    . Diclofenac Sodium 3 % GEL Rub into site of pain tid.  4 gram dose. 5 Tube 5  . lisinopril (PRINIVIL,ZESTRIL) 10 MG tablet Take 10 mg by mouth daily.    Marland Kitchen omeprazole (PRILOSEC) 20 MG capsule Take 20 mg by mouth 2 (two) times daily.    . simvastatin (ZOCOR) 40 MG tablet Take 40 mg by mouth every evening.     No current facility-administered medications for this visit.     Physical Exam  Blood pressure 150/85, pulse 70, temperature 97.5 F (36.4 C), height 5\' 3"  (1.6 m), weight 144 lb (65.318 kg).  Constitutional: overall normal hygiene, normal nutrition, well developed, normal grooming, normal body habitus. Assistive device:none  Musculoskeletal: gait and station Limp none, muscle tone and strength are normal, no tremors or atrophy is present.  .  Neurological: coordination overall normal.  Deep tendon reflex/nerve stretch intact.  Sensation normal.  Cranial nerves II-XII intact.   Skin:   normal overall no scars, lesions, ulcers or rashes. No psoriasis.  Psychiatric: Alert and oriented x 3.  Recent memory intact, remote memory unclear.  Normal mood and affect. Well groomed.  Good eye contact.  Cardiovascular: overall no swelling, no varicosities, no edema bilaterally, normal temperatures of the legs and arms, no clubbing, cyanosis and good  capillary refill.  Lymphatic: palpation is normal.   Extremities:the right ankle is doing well with no redness, no swelling. Inspection normal right ankle Strength and tone normal Range of motion full ROM of the right ankle.  The patient has been educated about the nature of the problem(s) and counseled on treatment options.  The patient appeared to understand what I have discussed and is in agreement with it.  Encounter Diagnosis  Name Primary?  . Right ankle pain Yes    PLAN Call if any problems.  Precautions discussed.  Continue current medications.   Return to clinic  PRN

## 2016-04-10 ENCOUNTER — Emergency Department (HOSPITAL_COMMUNITY)
Admission: EM | Admit: 2016-04-10 | Discharge: 2016-04-10 | Disposition: A | Discharge status: Home / Self Care | Payer: Medicare Other | Attending: Emergency Medicine | Admitting: Emergency Medicine

## 2016-04-10 ENCOUNTER — Encounter (HOSPITAL_COMMUNITY): Payer: Self-pay | Admitting: Emergency Medicine

## 2016-04-10 DIAGNOSIS — R197 Diarrhea, unspecified: Secondary | ICD-10-CM | POA: Insufficient documentation

## 2016-04-10 DIAGNOSIS — I10 Essential (primary) hypertension: Secondary | ICD-10-CM | POA: Diagnosis not present

## 2016-04-10 DIAGNOSIS — Z7982 Long term (current) use of aspirin: Secondary | ICD-10-CM | POA: Insufficient documentation

## 2016-04-10 DIAGNOSIS — E860 Dehydration: Secondary | ICD-10-CM | POA: Insufficient documentation

## 2016-04-10 DIAGNOSIS — K648 Other hemorrhoids: Secondary | ICD-10-CM

## 2016-04-10 DIAGNOSIS — K625 Hemorrhage of anus and rectum: Secondary | ICD-10-CM | POA: Diagnosis present

## 2016-04-10 LAB — CBC
HEMATOCRIT: 36.5 % (ref 36.0–46.0)
HEMOGLOBIN: 12.3 g/dL (ref 12.0–15.0)
MCH: 29.7 pg (ref 26.0–34.0)
MCHC: 33.7 g/dL (ref 30.0–36.0)
MCV: 88.2 fL (ref 78.0–100.0)
Platelets: 262 10*3/uL (ref 150–400)
RBC: 4.14 MIL/uL (ref 3.87–5.11)
RDW: 12.7 % (ref 11.5–15.5)
WBC: 10.6 10*3/uL — ABNORMAL HIGH (ref 4.0–10.5)

## 2016-04-10 LAB — COMPREHENSIVE METABOLIC PANEL
ALBUMIN: 3.9 g/dL (ref 3.5–5.0)
ALT: 18 U/L (ref 14–54)
ANION GAP: 9 (ref 5–15)
AST: 27 U/L (ref 15–41)
Alkaline Phosphatase: 66 U/L (ref 38–126)
BILIRUBIN TOTAL: 1 mg/dL (ref 0.3–1.2)
BUN: 13 mg/dL (ref 6–20)
CALCIUM: 8.8 mg/dL — AB (ref 8.9–10.3)
CO2: 22 mmol/L (ref 22–32)
Chloride: 98 mmol/L — ABNORMAL LOW (ref 101–111)
Creatinine, Ser: 0.92 mg/dL (ref 0.44–1.00)
GFR calc Af Amer: >60 mL/min (ref >60)
GFR calc non Af Amer: >60 mL/min (ref >60)
GLUCOSE: 78 mg/dL (ref 65–99)
Potassium: 3.9 mmol/L (ref 3.5–5.1)
SODIUM: 129 mmol/L — AB (ref 135–145)
Total Protein: 6 g/dL — ABNORMAL LOW (ref 6.5–8.1)

## 2016-04-10 LAB — TYPE AND SCREEN
ABO/RH(D): O NEG
Antibody Screen: NEGATIVE

## 2016-04-10 LAB — ABO/RH: ABO/RH(D): O NEG

## 2016-04-10 MED ORDER — HYDROCORTISONE ACETATE 25 MG RE SUPP: 25.0000 mg | Freq: Two times a day (BID) | RECTAL | 0 refills | Status: DC

## 2016-04-10 MED ORDER — SODIUM CHLORIDE 0.9 % IV BOLUS (SEPSIS)
1000.0000 mL | Freq: Once | INTRAVENOUS | Status: AC
  Administered 2016-04-10: 1000 mL via INTRAVENOUS

## 2016-04-10 NOTE — ED Provider Notes (Signed)
[facility] DEPT Provider Note   CSN: [id number] Arrival date & time: 04/10/16  1022     History   Chief Complaint Chief Complaint  Patient presents with  . Rectal Bleeding    HPI Tasha Peters is a 76 y.o. female.  The history is provided by the patient.  Rectal Bleeding  Quality:  Bright red Amount:  Moderate Duration: 2 days ago but nothing yesterday and only blood spot in her underwear today. Timing:  Intermittent Chronicity:  New Context: diarrhea   Context comment:  Saturday had 10-15 episodes of diarrhea and bleeding started later in the day Similar prior episodes: no   Relieved by:  None tried Worsened by:  Nothing Ineffective treatments:  None tried Associated symptoms: no abdominal pain, no fever, no light-headedness, no loss of consciousness and no vomiting   Associated symptoms comment:  Patient states she was having abdominal pain on Saturday but denies any abdominal pain now. She has not had any diarrhea since Saturday. She is feeling generally weak and worn out but was able to eat yesterday. No fever or recent antibiotic use. Risk factors: hx of IBD   Risk factors: no anticoagulant use     Past Medical History:  Diagnosis Date  . Acid reflux   . Hypercholesterolemia   . Hypertension     Patient Active Problem List   Diagnosis Date Noted  . THORACIC/LUMBOSACRAL NEURITIS/RADICULITIS UNSPEC 07/04/2007  . HYPERLIPIDEMIA 02/18/2007  . GERD 02/18/2007  . PEPTIC ULCER DISEASE 02/18/2007  . IBS 02/18/2007  . ARTHRITIS 02/18/2007  . DEGENERATIVE DISC DISEASE, CERVICAL SPINE 02/18/2007  . MALAISE AND FATIGUE 02/18/2007    Past Surgical History:  Procedure Laterality Date  . ABDOMINAL HYSTERECTOMY  1979  . CARPAL TUNNEL RELEASE  03/2010, 03/2010   both wrists  . colonoscopy  08/31/2004  . ESOPHAGOGASTRODUODENOSCOPY  01/04/2001, 04/20/1999, 08/25/1997  . NISSEN FUNDOPLICATION  51/03/5850 & 10/21/1997  . TONSILLECTOMY    . TUBAL LIGATION  1972  .  TYMPANOPLASTY  1964    OB History    No data available       Home Medications    Prior to Admission medications   Medication Sig Start Date End Date Taking? Authorizing Provider  allopurinol (ZYLOPRIM) 300 MG tablet Take 1 tablet (300 mg total) by mouth daily. 04/14/15   Sanjuana Kava, MD  aspirin 81 MG tablet Take 81 mg by mouth daily.    Historical Provider, MD  Diclofenac Sodium 3 % GEL Rub into site of pain tid.  4 gram dose. 03/25/15   Sanjuana Kava, MD  lisinopril (PRINIVIL,ZESTRIL) 10 MG tablet Take 10 mg by mouth daily.    Historical Provider, MD  omeprazole (PRILOSEC) 20 MG capsule Take 20 mg by mouth 2 (two) times daily.    Historical Provider, MD  simvastatin (ZOCOR) 40 MG tablet Take 40 mg by mouth every evening.    Historical Provider, MD    Family History No family history on file.  Social History Social History  Substance Use Topics  . Smoking status: Never Smoker  . Smokeless tobacco: Not on file  . Alcohol use No     Allergies   Lactose intolerance (gi) and Codeine   Review of Systems Review of Systems  Constitutional: Negative for fever.  Gastrointestinal: Positive for hematochezia. Negative for abdominal pain and vomiting.  Neurological: Negative for loss of consciousness and light-headedness.  All other systems reviewed and are negative.    Physical Exam Updated Vital Signs BP 94/56  Pulse 60   Temp 98.1 F (36.7 C) (Oral)   Resp 12   SpO2 96%   Physical Exam  Constitutional: She is oriented to person, place, and time. She appears well-developed and well-nourished. No distress.  HENT:  Head: Normocephalic and atraumatic.  Mouth/Throat: Oropharynx is clear and moist.  Eyes: Conjunctivae and EOM are normal. Pupils are equal, round, and reactive to light.  Neck: Normal range of motion. Neck supple.  Cardiovascular: Normal rate, regular rhythm and intact distal pulses.   No murmur heard. Pulmonary/Chest: Effort normal and breath sounds  normal. No respiratory distress. She has no wheezes. She has no rales.  Abdominal: Soft. She exhibits no distension. There is no tenderness. There is no rebound and no guarding.  Genitourinary: Rectal exam shows external hemorrhoid and internal hemorrhoid.  Genitourinary Comments: Engorged nonbleeding external hemorrhoids. On rectal exam bright red blood on finger with normal color stool. Internal hemorrhoids suspected  Musculoskeletal: Normal range of motion. She exhibits no edema or tenderness.  Neurological: She is alert and oriented to person, place, and time.  Skin: Skin is warm and dry. No rash noted. No erythema.  Psychiatric: She has a normal mood and affect. Her behavior is normal.  Nursing note and vitals reviewed.    ED Treatments / Results  Labs (all labs ordered are listed, but only abnormal results are displayed) Labs Reviewed  COMPREHENSIVE METABOLIC PANEL - Abnormal; Notable for the following:       Result Value   Sodium 129 (*)    Chloride 98 (*)    Calcium 8.8 (*)    Total Protein 6.0 (*)    All other components within normal limits  CBC - Abnormal; Notable for the following:    WBC 10.6 (*)    All other components within normal limits  POC OCCULT BLOOD, ED  TYPE AND SCREEN    EKG  EKG Interpretation None       Radiology No results found.  Procedures Procedures (including critical care time)  Medications Ordered in ED Medications  sodium chloride 0.9 % bolus 1,000 mL (1,000 mLs Intravenous New Bag/Given 04/10/16 1300)     Initial Impression / Assessment and Plan / ED Course  I have reviewed the triage vital signs and the nursing notes.  Pertinent labs & imaging results that were available during my care of the patient were reviewed by me and considered in my medical decision making (see chart for details).     Patient presenting with rectal bleeding and diarrhea that started on Saturday. Patient had numerous episodes of diarrhea and then  started seeing bright red blood. She denies any dark blood or clot at any point in time. She has not had any further diarrhea since Saturday but this morning had some blood on her underwear that was dried. She has not had a bowel movement today. She feels generally weak and drained from having that much diarrhea but states she starting to get better. She is able to ambulate without difficulty. No nausea or vomiting. She has no abdominal pain at this time. On exam she has bright red blood on finger tip with rectal exam consistent with internal hemorrhoids. Hemoglobin is not significantly changed and BMP with mild hyponatremia but otherwise normal. Patient given IV fluids. She is feeling better. We'll discharge home to follow-up with GI if symptoms persist.  Final Clinical Impressions(s) / ED Diagnoses   Final diagnoses:  Internal hemorrhoid, bleeding  Diarrhea, unspecified type  Dehydration  New Prescriptions New Prescriptions   No medications on file     Blanchie Dessert, MD 04/10/16 1315

## 2016-04-10 NOTE — ED Triage Notes (Signed)
Pt reports bright red blood coming from rectum since Saturday, pt reports prior to bleeding she had very bad diarrhea. Pt a/ox 4, resp e/u, skin warm and dry.

## 2018-04-02 DIAGNOSIS — R32 Unspecified urinary incontinence: Secondary | ICD-10-CM | POA: Diagnosis not present

## 2018-04-02 DIAGNOSIS — R69 Illness, unspecified: Secondary | ICD-10-CM | POA: Diagnosis not present

## 2018-04-02 DIAGNOSIS — I2510 Atherosclerotic heart disease of native coronary artery without angina pectoris: Secondary | ICD-10-CM | POA: Diagnosis not present

## 2018-04-02 DIAGNOSIS — Z885 Allergy status to narcotic agent status: Secondary | ICD-10-CM | POA: Diagnosis not present

## 2018-04-02 DIAGNOSIS — K219 Gastro-esophageal reflux disease without esophagitis: Secondary | ICD-10-CM | POA: Diagnosis not present

## 2018-04-02 DIAGNOSIS — I10 Essential (primary) hypertension: Secondary | ICD-10-CM | POA: Diagnosis not present

## 2018-04-02 DIAGNOSIS — E785 Hyperlipidemia, unspecified: Secondary | ICD-10-CM | POA: Diagnosis not present

## 2018-04-02 DIAGNOSIS — Z8249 Family history of ischemic heart disease and other diseases of the circulatory system: Secondary | ICD-10-CM | POA: Diagnosis not present

## 2018-05-08 ENCOUNTER — Ambulatory Visit: Payer: Medicare Other | Admitting: Cardiovascular Disease

## 2018-06-07 DIAGNOSIS — K219 Gastro-esophageal reflux disease without esophagitis: Secondary | ICD-10-CM | POA: Diagnosis not present

## 2018-06-07 DIAGNOSIS — M545 Low back pain: Secondary | ICD-10-CM | POA: Diagnosis not present

## 2018-06-07 DIAGNOSIS — Z789 Other specified health status: Secondary | ICD-10-CM | POA: Diagnosis not present

## 2018-06-07 DIAGNOSIS — I2510 Atherosclerotic heart disease of native coronary artery without angina pectoris: Secondary | ICD-10-CM | POA: Diagnosis not present

## 2018-06-07 DIAGNOSIS — Z299 Encounter for prophylactic measures, unspecified: Secondary | ICD-10-CM | POA: Diagnosis not present

## 2018-06-07 DIAGNOSIS — Z6823 Body mass index (BMI) 23.0-23.9, adult: Secondary | ICD-10-CM | POA: Diagnosis not present

## 2018-06-07 DIAGNOSIS — M5136 Other intervertebral disc degeneration, lumbar region: Secondary | ICD-10-CM | POA: Diagnosis not present

## 2018-06-21 DIAGNOSIS — R350 Frequency of micturition: Secondary | ICD-10-CM | POA: Diagnosis not present

## 2018-06-21 DIAGNOSIS — N390 Urinary tract infection, site not specified: Secondary | ICD-10-CM | POA: Diagnosis not present

## 2018-06-21 DIAGNOSIS — Z6823 Body mass index (BMI) 23.0-23.9, adult: Secondary | ICD-10-CM | POA: Diagnosis not present

## 2018-06-21 DIAGNOSIS — I2510 Atherosclerotic heart disease of native coronary artery without angina pectoris: Secondary | ICD-10-CM | POA: Diagnosis not present

## 2018-06-21 DIAGNOSIS — Z299 Encounter for prophylactic measures, unspecified: Secondary | ICD-10-CM | POA: Diagnosis not present

## 2018-07-09 DIAGNOSIS — E2839 Other primary ovarian failure: Secondary | ICD-10-CM | POA: Diagnosis not present

## 2018-07-09 DIAGNOSIS — Z79899 Other long term (current) drug therapy: Secondary | ICD-10-CM | POA: Diagnosis not present

## 2018-07-09 DIAGNOSIS — Z1211 Encounter for screening for malignant neoplasm of colon: Secondary | ICD-10-CM | POA: Diagnosis not present

## 2018-07-09 DIAGNOSIS — Z299 Encounter for prophylactic measures, unspecified: Secondary | ICD-10-CM | POA: Diagnosis not present

## 2018-07-09 DIAGNOSIS — Z6823 Body mass index (BMI) 23.0-23.9, adult: Secondary | ICD-10-CM | POA: Diagnosis not present

## 2018-07-09 DIAGNOSIS — Z1331 Encounter for screening for depression: Secondary | ICD-10-CM | POA: Diagnosis not present

## 2018-07-09 DIAGNOSIS — Z0000 Encounter for general adult medical examination without abnormal findings: Secondary | ICD-10-CM | POA: Diagnosis not present

## 2018-07-09 DIAGNOSIS — Z1339 Encounter for screening examination for other mental health and behavioral disorders: Secondary | ICD-10-CM | POA: Diagnosis not present

## 2018-07-09 DIAGNOSIS — I2510 Atherosclerotic heart disease of native coronary artery without angina pectoris: Secondary | ICD-10-CM | POA: Diagnosis not present

## 2018-07-09 DIAGNOSIS — Z7189 Other specified counseling: Secondary | ICD-10-CM | POA: Diagnosis not present

## 2018-07-09 DIAGNOSIS — E559 Vitamin D deficiency, unspecified: Secondary | ICD-10-CM | POA: Diagnosis not present

## 2018-07-15 DIAGNOSIS — E2839 Other primary ovarian failure: Secondary | ICD-10-CM | POA: Diagnosis not present

## 2018-07-22 ENCOUNTER — Ambulatory Visit (INDEPENDENT_AMBULATORY_CARE_PROVIDER_SITE_OTHER): Payer: Medicare HMO | Admitting: Cardiovascular Disease

## 2018-07-22 ENCOUNTER — Encounter: Payer: Self-pay | Admitting: Cardiovascular Disease

## 2018-07-22 ENCOUNTER — Other Ambulatory Visit: Payer: Self-pay

## 2018-07-22 VITALS — BP 130/70 | HR 74 | Ht 63.0 in | Wt 137.0 lb

## 2018-07-22 DIAGNOSIS — I10 Essential (primary) hypertension: Secondary | ICD-10-CM

## 2018-07-22 DIAGNOSIS — I25118 Atherosclerotic heart disease of native coronary artery with other forms of angina pectoris: Secondary | ICD-10-CM | POA: Diagnosis not present

## 2018-07-22 DIAGNOSIS — E785 Hyperlipidemia, unspecified: Secondary | ICD-10-CM | POA: Diagnosis not present

## 2018-07-22 NOTE — Progress Notes (Signed)
Cardiology Consultation:   Patient ID: Tasha Peters MRN: [id number]; DOB: 1940-04-21  Admit date: (Not on file) Date of Consult: 07/22/2018  Primary Care Provider: Medicine, [facility] Primary Cardiologist: No primary care provider on file.  Primary Electrophysiologist:  None    Patient Profile:   Tasha Peters is a 78 y.o. female with a hx of CAD who is being seen today for the evaluation of CAD at the request of Dr. Tommie Sams.  History of Present Illness:   Tasha Peters is a 78 year old woman who was previously seeing a cardiologist in [place], [place].  I did an extensive review of her records.  She reportedly has chronic ischemic heart disease.  She apparently underwent a negative stress echocardiogram for inducible ischemia in November 2018.  She has a history of atypical chest pain.    Upon speaking with her, she told me she underwent coronary artery stent placement on October 27, 2015.  She got a copy of the stent card but does not know where it is.  She had her lipids checked last week by her PCP and was told her LDL is elevated.  She told me she has "several other blockages "but none that needed stents.  She tries to eat healthy and bakes at home.  She lives in an apartment by herself.  She has been widowed since 2013.  That was her second marriage.  They had been married for 16 years.  A sister died in 27-May-2022 of this year at the age of 72.  She has another sister with Alzheimer's dementia who causes her a lot of stress as she blames her for stealing money and possessions.  The patient currently denies any exertional chest pain and dyspnea.  She had chest pain on 06/17/2018 and had to take 2 nitroglycerin.  She said this was after receiving several phone calls from her sister with Alzheimer's accusing her of stealing things.  She denies orthopnea, palpitations, and paroxysmal nocturnal dyspnea.  She denies any significant leg edema.    Past Medical History:   Diagnosis Date  . Acid reflux   . Hypercholesterolemia   . Hypertension     Past Surgical History:  Procedure Laterality Date  . ABDOMINAL HYSTERECTOMY  1979  . CARPAL TUNNEL RELEASE  03/2010, 03/2010   both wrists  . colonoscopy  08/31/2004  . ESOPHAGOGASTRODUODENOSCOPY  01/04/2001, 04/20/1999, 08/25/1997  . NISSEN FUNDOPLICATION  67/61/9509 & 10/21/1997  . TONSILLECTOMY    . TUBAL LIGATION  1972  . TYMPANOPLASTY  1964     Home Medications:  Prior to Admission medications   Medication Sig Start Date End Date Taking? Authorizing Provider  aspirin 81 MG tablet Take 81 mg by mouth daily.   Yes [provider]  atorvastatin (LIPITOR) 80 MG tablet Take 80 mg by mouth daily. 03/20/16  Yes [provider]  lisinopril (PRINIVIL,ZESTRIL) 2.5 MG tablet Take 2.5 mg by mouth daily.   Yes [provider]  metoprolol (LOPRESSOR) 50 MG tablet Take 50 mg by mouth 2 (two) times daily. 03/13/16  Yes [provider]  pantoprazole (PROTONIX) 20 MG tablet Take 20 mg by mouth daily.   Yes [provider]    Inpatient Medications: Scheduled Meds:  Continuous Infusions:  PRN Meds:   Allergies:    Allergies  Allergen Reactions  . Lactose Intolerance (Gi)   . Codeine Itching and Nausea And Vomiting    Social History:   Social History   Socioeconomic History  .  Marital status: Widowed    Spouse name: Not on file  . Number of children: Not on file  . Years of education: Not on file  . Highest education level: Not on file  Occupational History  . Not on file  Social Needs  . Financial resource strain: Not on file  . Food insecurity    Worry: Not on file    Inability: Not on file  . Transportation needs    Medical: Not on file    Non-medical: Not on file  Tobacco Use  . Smoking status: Never Smoker  . Smokeless tobacco: Never Used  Substance and Sexual Activity  . Alcohol use: No  . Drug use: No  . Sexual activity: Not on file  Lifestyle   . Physical activity    Days per week: Not on file    Minutes per session: Not on file  . Stress: Not on file  Relationships  . Social Herbalist on phone: Not on file    Gets together: Not on file    Attends religious service: Not on file    Active member of club or organization: Not on file    Attends meetings of clubs or organizations: Not on file    Relationship status: Not on file  . Intimate partner violence    Fear of current or ex partner: Not on file    Emotionally abused: Not on file    Physically abused: Not on file    Forced sexual activity: Not on file  Other Topics Concern  . Not on file  Social History Narrative  . Not on file    Family History:   No history of premature coronary artery disease.  ROS:  Please see the history of present illness.   All other ROS reviewed and negative.     Physical Exam/Data:   Vitals:   07/22/18 0842  BP: 130/70  Pulse: 74  SpO2: 96%  Weight: 137 lb (62.1 kg)  Height: 5\' 3"  (1.6 m)   @IOBRIEF @ Last 3 Weights 07/22/2018 04/29/2015 04/08/2015  Weight (lbs) 137 lb 144 lb 144 lb  Weight (kg) 62.143 kg 65.318 kg 65.318 kg     Body mass index is 24.27 kg/m.  General:  Well nourished, well developed, in no acute distress HEENT: normal Lymph: no adenopathy Neck: no JVD Endocrine:  No thryomegaly Vascular: No carotid bruits; FA pulses 2+ bilaterally without bruits  Cardiac:  normal S1, S2; RRR; no murmur  Lungs:  clear to auscultation bilaterally, no wheezing, rhonchi or rales  Abd: soft, nontender, no hepatomegaly  Ext: no edema Musculoskeletal:  No deformities, BUE and BLE strength normal and equal Skin: warm and dry  Neuro:  CNs 2-12 intact, no focal abnormalities noted Psych:  Normal affect   EKG:  The EKG was personally reviewed and demonstrates: Sinus bradycardia, 53 bpm.  Relevant CV Studies: None available  Laboratory Data:  ChemistryNo results for input(s): NA, K, CL, CO2, GLUCOSE, BUN,  CREATININE, CALCIUM, GFRNONAA, GFRAA, ANIONGAP in the last 168 hours.  No results for input(s): PROT, ALBUMIN, AST, ALT, ALKPHOS, BILITOT in the last 168 hours. HematologyNo results for input(s): WBC, RBC, HGB, HCT, MCV, MCH, MCHC, RDW, PLT in the last 168 hours. Cardiac EnzymesNo results for input(s): TROPONINI in the last 168 hours. No results for input(s): TROPIPOC in the last 168 hours.  BNPNo results for input(s): BNP, PROBNP in the last 168 hours.  DDimer No results for input(s): DDIMER in the  last 168 hours.  Radiology/Studies:  No results found.  Assessment and Plan:   1.  Coronary artery disease: History of stent placement on 10/27/2015.  I will request a copy of the cardiac catheterization report from [place], [place].  I will also request a copy of her stress echocardiogram in 2018.  I will request a copy of lipids.  She appears to be symptomatically stable.  I will discontinue Plavix as it has been nearly 3 years since stent placement.  I will continue aspirin, atorvastatin, and metoprolol.  2.  Hypertension: Blood pressures controlled.  No changes to therapy.  3.  Hyperlipidemia: Currently on atorvastatin 80 mg.  I will request a copy of lipids from PCP performed last week.  Follow-up in 4 months.   For questions or updates, please contact [facility] Please consult [email or web address] for contact info under     Signed, Kate Sable, MD  07/22/2018 9:12 AM

## 2018-07-22 NOTE — Patient Instructions (Signed)
Medication Instructions:  STOP Plavix  Labwork: None today  Procedures/Testing: None today  Follow-Up: 4 months with Dr.Koneswaran  Any Additional Special Instructions Will Be Listed Below (If Applicable).     If you need a refill on your cardiac medications before your next appointment, please call your pharmacy.       Thank you for choosing [facility] !

## 2018-08-19 DIAGNOSIS — Z1231 Encounter for screening mammogram for malignant neoplasm of breast: Secondary | ICD-10-CM | POA: Diagnosis not present

## 2018-10-10 DIAGNOSIS — Z713 Dietary counseling and surveillance: Secondary | ICD-10-CM | POA: Diagnosis not present

## 2018-10-10 DIAGNOSIS — I2510 Atherosclerotic heart disease of native coronary artery without angina pectoris: Secondary | ICD-10-CM | POA: Diagnosis not present

## 2018-10-10 DIAGNOSIS — Z299 Encounter for prophylactic measures, unspecified: Secondary | ICD-10-CM | POA: Diagnosis not present

## 2018-10-10 DIAGNOSIS — Z6824 Body mass index (BMI) 24.0-24.9, adult: Secondary | ICD-10-CM | POA: Diagnosis not present

## 2018-11-15 DIAGNOSIS — R69 Illness, unspecified: Secondary | ICD-10-CM | POA: Diagnosis not present

## 2018-12-11 ENCOUNTER — Ambulatory Visit: Payer: Self-pay

## 2018-12-11 ENCOUNTER — Encounter: Payer: Self-pay | Admitting: Orthopaedic Surgery

## 2018-12-11 ENCOUNTER — Other Ambulatory Visit: Payer: Self-pay

## 2018-12-11 ENCOUNTER — Ambulatory Visit (INDEPENDENT_AMBULATORY_CARE_PROVIDER_SITE_OTHER): Payer: Medicare HMO | Admitting: Orthopaedic Surgery

## 2018-12-11 ENCOUNTER — Telehealth: Payer: Self-pay

## 2018-12-11 VITALS — BP 117/58 | HR 75 | Ht 64.0 in | Wt 135.0 lb

## 2018-12-11 DIAGNOSIS — M7062 Trochanteric bursitis, left hip: Secondary | ICD-10-CM

## 2018-12-11 DIAGNOSIS — M25552 Pain in left hip: Secondary | ICD-10-CM

## 2018-12-11 NOTE — Progress Notes (Signed)
Subjective:    Patient ID: Tasha Peters, female    DOB: 10-08-1940, 78 y.o.   MRN: [id number]  HPI She has had pain in the left hip laterally that is getting worse for several weeks.  It is very tender. She has pain getting up out of a chair and also rolling on it at night. She has tried Advil, ice with slight help. She has no trauma, no redness.  She has had lower back pain for a while and had x-rays done in May by order of another physician who has been treating her for this. She has had some left sided sciatica but not now.    Review of Systems  Constitutional: Positive for activity change.  Musculoskeletal: Positive for arthralgias, back pain, gait problem and myalgias.  All other systems reviewed and are negative.  For Review of Systems, all other systems reviewed and are negative.  The following is a summary of the past history medically, past history surgically, known current medicines, social history and family history.  This information is gathered electronically by the computer from prior information and documentation.  I review this each visit and have found including this information at this point in the chart is beneficial and informative.   Past Medical History:  Diagnosis Date  . Acid reflux   . Hypercholesterolemia   . Hypertension     Past Surgical History:  Procedure Laterality Date  . ABDOMINAL HYSTERECTOMY  1979  . CARPAL TUNNEL RELEASE  03/2010, 03/2010   both wrists  . colonoscopy  08/31/2004  . ESOPHAGOGASTRODUODENOSCOPY  01/04/2001, 04/20/1999, 08/25/1997  . NISSEN FUNDOPLICATION  [id number] & 10/21/1997  . TONSILLECTOMY    . TUBAL LIGATION  1972  . TYMPANOPLASTY  1964    Current Outpatient Medications on File Prior to Visit  Medication Sig Dispense Refill  . aspirin 81 MG tablet Take 81 mg by mouth daily.    Marland Kitchen atorvastatin (LIPITOR) 80 MG tablet Take 80 mg by mouth daily.    Marland Kitchen lisinopril (PRINIVIL,ZESTRIL) 2.5 MG tablet Take 2.5 mg by mouth daily.     . metoprolol (LOPRESSOR) 50 MG tablet Take 50 mg by mouth 2 (two) times daily.    . pantoprazole (PROTONIX) 20 MG tablet Take 20 mg by mouth daily.     No current facility-administered medications on file prior to visit.     Social History   Socioeconomic History  . Marital status: Widowed    Spouse name: Not on file  . Number of children: Not on file  . Years of education: Not on file  . Highest education level: Not on file  Occupational History  . Not on file  Social Needs  . Financial resource strain: Not on file  . Food insecurity    Worry: Not on file    Inability: Not on file  . Transportation needs    Medical: Not on file    Non-medical: Not on file  Tobacco Use  . Smoking status: Never Smoker  . Smokeless tobacco: Never Used  Substance and Sexual Activity  . Alcohol use: No  . Drug use: No  . Sexual activity: Not on file  Lifestyle  . Physical activity    Days per week: Not on file    Minutes per session: Not on file  . Stress: Not on file  Relationships  . Social Herbalist on phone: Not on file    Gets together: Not on file    Attends  religious service: Not on file    Active member of club or organization: Not on file    Attends meetings of clubs or organizations: Not on file    Relationship status: Not on file  . Intimate partner violence    Fear of current or ex partner: Not on file    Emotionally abused: Not on file    Physically abused: Not on file    Forced sexual activity: Not on file  Other Topics Concern  . Not on file  Social History Narrative  . Not on file    Family History  Problem Relation Age of Onset  . Heart attack Mother   . Heart failure Mother   . Heart attack Father     BP (!) 117/58   Pulse 75   Ht 5\' 4"  (1.626 m)   Wt 135 lb (61.2 kg)   BMI 23.17 kg/m   Body mass index is 23.17 kg/m.      Objective:   Physical Exam Vitals signs reviewed.  Constitutional:      Appearance: She is well-developed.   HENT:     Head: Normocephalic and atraumatic.  Eyes:     Conjunctiva/sclera: Conjunctivae normal.     Pupils: Pupils are equal, round, and reactive to light.  Neck:     Musculoskeletal: Normal range of motion and neck supple.  Cardiovascular:     Rate and Rhythm: Normal rate and regular rhythm.  Pulmonary:     Effort: Pulmonary effort is normal.  Abdominal:     Palpations: Abdomen is soft.  Musculoskeletal:       Legs:  Skin:    General: Skin is warm and dry.  Neurological:     Mental Status: She is alert and oriented to person, place, and time.     Cranial Nerves: No cranial nerve deficit.     Motor: No abnormal muscle tone.     Coordination: Coordination normal.     Deep Tendon Reflexes: Reflexes are normal and symmetric. Reflexes normal.  Psychiatric:        Behavior: Behavior normal.        Thought Content: Thought content normal.        Judgment: Judgment normal.      X-rays were done of the left hip, reported separately.     Assessment & Plan:   Encounter Diagnoses  Name Primary?  . Pain in left hip Yes  . Trochanteric bursitis, left hip    PROCEDURE NOTE:  The patient request injection, verbal consent was obtained.  The left trochanteric area of the hip was prepped appropriately after time out was performed.   Sterile technique was observed and injection of 1 cc of Depo-Medrol 40 mg with several cc's of plain xylocaine. Anesthesia was provided by ethyl chloride and a 20-gauge needle was used to inject the hip area. The injection was tolerated well.  A band aid dressing was applied.  The patient was advised to apply ice later today and tomorrow to the injection sight as needed.  Return in two weeks.  Call if any problem.  Precautions discussed.   Electronically Signed Sanjuana Kava, MD 11/11/202010:38 AM

## 2018-12-11 NOTE — Telephone Encounter (Signed)
Patient's daughter called to ask what to do since patient got nauseated on her way home from her appointment with [place] and now she is vomiting. Stated patient told her she didn't know what kind of shot we gave her and I told her it was a cortisone shot.  Please advise

## 2018-12-12 NOTE — Telephone Encounter (Signed)
Call her and see how she is this morning.  I just saw it.

## 2018-12-12 NOTE — Telephone Encounter (Signed)
Spoke with patient's daughter(Angie) this morning to see how her mom was doing. She stated mom stopped vomiting yesterday around lunchtime and has been feeling better since. Stated she thought maybe it was her mom's nerves that caused the vomiting since she was hurting from the shot. Told her to call us if she needs anything.

## 2018-12-17 ENCOUNTER — Ambulatory Visit: Payer: 59 | Admitting: Orthopaedic Surgery

## 2018-12-18 DIAGNOSIS — I2510 Atherosclerotic heart disease of native coronary artery without angina pectoris: Secondary | ICD-10-CM | POA: Diagnosis not present

## 2018-12-18 DIAGNOSIS — K219 Gastro-esophageal reflux disease without esophagitis: Secondary | ICD-10-CM | POA: Diagnosis not present

## 2018-12-18 DIAGNOSIS — Z6823 Body mass index (BMI) 23.0-23.9, adult: Secondary | ICD-10-CM | POA: Diagnosis not present

## 2018-12-18 DIAGNOSIS — E559 Vitamin D deficiency, unspecified: Secondary | ICD-10-CM | POA: Diagnosis not present

## 2018-12-18 DIAGNOSIS — I10 Essential (primary) hypertension: Secondary | ICD-10-CM | POA: Diagnosis not present

## 2018-12-18 DIAGNOSIS — Z955 Presence of coronary angioplasty implant and graft: Secondary | ICD-10-CM | POA: Diagnosis not present

## 2018-12-18 DIAGNOSIS — K635 Polyp of colon: Secondary | ICD-10-CM | POA: Diagnosis not present

## 2018-12-18 DIAGNOSIS — M7062 Trochanteric bursitis, left hip: Secondary | ICD-10-CM | POA: Diagnosis not present

## 2018-12-18 DIAGNOSIS — E782 Mixed hyperlipidemia: Secondary | ICD-10-CM | POA: Diagnosis not present

## 2018-12-30 ENCOUNTER — Encounter: Payer: Self-pay | Admitting: *Deleted

## 2018-12-30 ENCOUNTER — Telehealth: Payer: Self-pay | Admitting: Cardiovascular Disease

## 2018-12-30 ENCOUNTER — Other Ambulatory Visit: Payer: Self-pay

## 2018-12-30 ENCOUNTER — Ambulatory Visit (INDEPENDENT_AMBULATORY_CARE_PROVIDER_SITE_OTHER): Payer: Medicare HMO | Admitting: Cardiovascular Disease

## 2018-12-30 ENCOUNTER — Encounter: Payer: Self-pay | Admitting: Cardiovascular Disease

## 2018-12-30 VITALS — BP 122/60 | HR 58 | Ht 64.0 in | Wt 139.0 lb

## 2018-12-30 DIAGNOSIS — I10 Essential (primary) hypertension: Secondary | ICD-10-CM | POA: Diagnosis not present

## 2018-12-30 DIAGNOSIS — R0789 Other chest pain: Secondary | ICD-10-CM | POA: Diagnosis not present

## 2018-12-30 DIAGNOSIS — E785 Hyperlipidemia, unspecified: Secondary | ICD-10-CM

## 2018-12-30 DIAGNOSIS — I25118 Atherosclerotic heart disease of native coronary artery with other forms of angina pectoris: Secondary | ICD-10-CM

## 2018-12-30 DIAGNOSIS — E782 Mixed hyperlipidemia: Secondary | ICD-10-CM | POA: Diagnosis not present

## 2018-12-30 DIAGNOSIS — R69 Illness, unspecified: Secondary | ICD-10-CM | POA: Diagnosis not present

## 2018-12-30 DIAGNOSIS — Z6823 Body mass index (BMI) 23.0-23.9, adult: Secondary | ICD-10-CM | POA: Diagnosis not present

## 2018-12-30 NOTE — Telephone Encounter (Signed)
Virtual Visit Pre-Appointment Phone Call  "(Name), I am calling you today to discuss your upcoming appointment. We are currently trying to limit exposure to the virus that causes COVID-19 by seeing patients at home rather than in the office."  1. "What is the BEST phone number to call the day of the visit?" - Do you have or have access to (through a family member/friend) a smartphone with video capability that we can use for your visit?" [phone] 2.  a. If yes - list this number in appt notes as cell (if different from BEST phone #) and list the appointment type as a VIDEO visit in appointment notes b. If no - list the appointment type as a PHONE visit in appointment notes  3. Confirm consent - "In the setting of the current Covid19 crisis, you are scheduled for a (phone or video) visit with your provider on (date) at (time).  Just as we do with many in-office visits, in order for you to participate in this visit, we must obtain consent.  If you'd like, I can send this to your mychart (if signed up) or email for you to review.  Otherwise, I can obtain your verbal consent now.  All virtual visits are billed to your insurance company just like a normal visit would be.  By agreeing to a virtual visit, we'd like you to understand that the technology does not allow for your provider to perform an examination, and thus may limit your provider's ability to fully assess your condition. If your provider identifies any concerns that need to be evaluated in person, we will make arrangements to do so.  Finally, though the technology is pretty good, we cannot assure that it will always work on either your or our end, and in the setting of a video visit, we may have to convert it to a phone-only visit.  In either situation, we cannot ensure that we have a secure connection.  Are you willing to proceed?" STAFF: Did the patient verbally acknowledge consent to telehealth visit? Document YES/NO here:  YES  4. Advise patient to be prepared - "Two hours prior to your appointment, go ahead and check your blood pressure, pulse, oxygen saturation, and your weight (if you have the equipment to check those) and write them all down. When your visit starts, your provider will ask you for this information. If you have an Apple Watch or Kardia device, please plan to have heart rate information ready on the day of your appointment. Please have a pen and paper handy nearby the day of the visit as well."  5. Give patient instructions for MyChart download to smartphone OR Doximity/Doxy.me as below if video visit (depending on what platform provider is using)  6. Inform patient they will receive a phone call 15 minutes prior to their appointment time (may be from unknown caller ID) so they should be prepared to answer    Tasha Peters has been deemed a candidate for a follow-up tele-health visit to limit community exposure during the Covid-19 pandemic. I spoke with the patient via phone to ensure availability of phone/video source, confirm preferred email & phone number, and discuss instructions and expectations.  I reminded Tasha Peters to be prepared with any vital sign and/or heart rhythm information that could potentially be obtained via home monitoring, at the time of her visit. I reminded Tasha Peters to expect a phone call prior to her visit.  Tasha  T Peters 12/30/2018 3:26 PM   INSTRUCTIONS FOR DOWNLOADING THE MYCHART APP TO SMARTPHONE  - The patient must first make sure to have activated MyChart and know their login information - If Apple, go to [employer] and type in MyChart in the search bar and download the app. If Android, ask patient to go to [employer] and type in [place] in the search bar and download the app. The app is free but as with any other app downloads, their phone may require them to verify saved payment information or Apple/Android password.  -  The patient will need to then log into the app with their MyChart username and password, and select [facility] as their healthcare provider to link the account. When it is time for your visit, go to the MyChart app, find appointments, and click Begin Video Visit. Be sure to Select Allow for your device to access the Microphone and Camera for your visit. You will then be connected, and your provider will be with you shortly.  **If they have any issues connecting, or need assistance please contact MyChart service desk [phone])**  **If using a computer, in order to ensure the best quality for their visit they will need to use either of the following Internet Browsers: [employer], or Google Chrome**  IF USING DOXIMITY or DOXY.ME - The patient will receive a link just prior to their visit by text.     FULL LENGTH CONSENT FOR TELE-HEALTH VISIT   I hereby voluntarily request, consent and authorize [facility] and its employed or contracted physicians, physician assistants, nurse practitioners or other licensed health care professionals (the Practitioner), to provide me with telemedicine health care services (the Services") as deemed necessary by the treating Practitioner. I acknowledge and consent to receive the Services by the Practitioner via telemedicine. I understand that the telemedicine visit will involve communicating with the Practitioner through live audiovisual communication technology and the disclosure of certain medical information by electronic transmission. I acknowledge that I have been given the opportunity to request an in-person assessment or other available alternative prior to the telemedicine visit and am voluntarily participating in the telemedicine visit.  I understand that I have the right to withhold or withdraw my consent to the use of telemedicine in the course of my care at any time, without affecting my right to future care or treatment, and that the  Practitioner or I may terminate the telemedicine visit at any time. I understand that I have the right to inspect all information obtained and/or recorded in the course of the telemedicine visit and may receive copies of available information for a reasonable fee.  I understand that some of the potential risks of receiving the Services via telemedicine include:   Delay or interruption in medical evaluation due to technological equipment failure or disruption;  Information transmitted may not be sufficient (e.g. poor resolution of images) to allow for appropriate medical decision making by the Practitioner; and/or   In rare instances, security protocols could fail, causing a breach of personal health information.  Furthermore, I acknowledge that it is my responsibility to provide information about my medical history, conditions and care that is complete and accurate to the best of my ability. I acknowledge that Practitioner's advice, recommendations, and/or decision may be based on factors not within their control, such as incomplete or inaccurate data provided by me or distortions of diagnostic images or specimens that may result from electronic transmissions. I understand that the practice  of medicine is not an Chief Strategy Officer and that Practitioner makes no warranties or guarantees regarding treatment outcomes. I acknowledge that I will receive a copy of this consent concurrently upon execution via email to the email address I last provided but may also request a printed copy by calling the office of [facility].    I understand that my insurance will be billed for this visit.   I have read or had this consent read to me.  I understand the contents of this consent, which adequately explains the benefits and risks of the Services being provided via telemedicine.   I have been provided ample opportunity to ask questions regarding this consent and the Services and have had my questions answered to my  satisfaction.  I give my informed consent for the services to be provided through the use of telemedicine in my medical care  By participating in this telemedicine visit I agree to the above.

## 2018-12-30 NOTE — Addendum Note (Signed)
Addended by: Laurine Blazer on: 12/30/2018 03:14 PM   Modules accepted: Orders

## 2018-12-30 NOTE — Progress Notes (Signed)
SUBJECTIVE: The patient presents for routine follow-up.  She has been having some chest tightness when walking uphill or walking up steps.  She did have them when walking cross level ground as well.  She has been experiencing right foot cramps but denies leg swelling, orthopnea and paroxysmal nocturnal dyspnea.  She seldom has palpitations.     Review of Systems: As per "subjective", otherwise negative.  Allergies  Allergen Reactions  . Lactose Intolerance (Gi)   . Codeine Itching and Nausea And Vomiting    Current Outpatient Medications  Medication Sig Dispense Refill  . aspirin 81 MG tablet Take 81 mg by mouth daily.    Marland Kitchen atorvastatin (LIPITOR) 80 MG tablet Take 80 mg by mouth daily.    . calcium carbonate (CALCIUM 600) 600 MG TABS tablet Take 600 mg by mouth 2 (two) times daily with a meal.    . lisinopril (PRINIVIL,ZESTRIL) 2.5 MG tablet Take 2.5 mg by mouth daily.    . metoprolol (LOPRESSOR) 50 MG tablet Take 50 mg by mouth 2 (two) times daily.    . pantoprazole (PROTONIX) 20 MG tablet Take 20 mg by mouth daily.     No current facility-administered medications for this visit.     Past Medical History:  Diagnosis Date  . Acid reflux   . Hypercholesterolemia   . Hypertension     Past Surgical History:  Procedure Laterality Date  . ABDOMINAL HYSTERECTOMY  1979  . CARPAL TUNNEL RELEASE  03/2010, 03/2010   both wrists  . colonoscopy  08/31/2004  . ESOPHAGOGASTRODUODENOSCOPY  01/04/2001, 04/20/1999, 08/25/1997  . NISSEN FUNDOPLICATION  [id number] & 10/21/1997  . TONSILLECTOMY    . TUBAL LIGATION  1972  . TYMPANOPLASTY  1964    Social History   Socioeconomic History  . Marital status: Widowed    Spouse name: Not on file  . Number of children: Not on file  . Years of education: Not on file  . Highest education level: Not on file  Occupational History  . Not on file  Social Needs  . Financial resource strain: Not on file  . Food insecurity    Worry: Not on file     Inability: Not on file  . Transportation needs    Medical: Not on file    Non-medical: Not on file  Tobacco Use  . Smoking status: Never Smoker  . Smokeless tobacco: Never Used  Substance and Sexual Activity  . Alcohol use: No  . Drug use: No  . Sexual activity: Not on file  Lifestyle  . Physical activity    Days per week: Not on file    Minutes per session: Not on file  . Stress: Not on file  Relationships  . Social Herbalist on phone: Not on file    Gets together: Not on file    Attends religious service: Not on file    Active member of club or organization: Not on file    Attends meetings of clubs or organizations: Not on file    Relationship status: Not on file  . Intimate partner violence    Fear of current or ex partner: Not on file    Emotionally abused: Not on file    Physically abused: Not on file    Forced sexual activity: Not on file  Other Topics Concern  . Not on file  Social History Narrative  . Not on file     Vitals:   12/30/18  1434  BP: 122/60  Pulse: (!) 58  SpO2: 98%  Weight: 139 lb (63 kg)  Height: 5\' 4"  (1.626 m)    Wt Readings from Last 3 Encounters:  12/30/18 139 lb (63 kg)  12/11/18 135 lb (61.2 kg)  07/22/18 137 lb (62.1 kg)     PHYSICAL EXAM General: NAD HEENT: Normal. Neck: No JVD, no thyromegaly. Lungs: Clear to auscultation bilaterally with normal respiratory effort. CV: Regular rate and rhythm, normal S1/S2, no S3/S4, no murmur. No pretibial or periankle edema.  No carotid bruit.   Abdomen: Soft, nontender, no distention.  Neurologic: Alert and oriented.  Psych: Normal affect. Skin: Normal. Musculoskeletal: No gross deformities.      Labs: Lab Results  Component Value Date/Time   K 3.9 04/10/2016 10:35 AM   BUN 13 04/10/2016 10:35 AM   CREATININE 0.92 04/10/2016 10:35 AM   ALT 18 04/10/2016 10:35 AM   TSH 2.445 02/20/2007 08:17 PM   HGB 12.3 04/10/2016 10:35 AM     Lipids: Lab Results   Component Value Date/Time   LDLCALC 150 (H) 12/25/2007 07:37 PM   CHOL 243 (H) 12/25/2007 07:37 PM   TRIG 247 (H) 12/25/2007 07:37 PM   HDL 44 12/25/2007 07:37 PM       ASSESSMENT AND PLAN: 1.  Coronary artery disease: History of stent placement on 10/27/2015.  Catheterization was performed in [place], [place].  Normal stress echocardiogram in November 2018.  She has been having chest tightness with exertion when walking uphill and occasionally on level ground.  I will obtain a Lexiscan Myoview. I will continue aspirin, atorvastatin, and metoprolol.  2.  Hypertension: Blood pressure is controlled.  No changes to therapy.  3.  Hyperlipidemia: Currently on atorvastatin 80 mg.    Disposition: Follow up 3 months   Kate Sable, M.D., F.A.C.C.

## 2018-12-30 NOTE — Patient Instructions (Signed)
Medication Instructions:  Continue all current medications.  Labwork: none  Testing/Procedures:  Your physician has requested that you have a lexiscan myoview. For further information please visit [email or web address]. Please follow instruction sheet, as given.  Office will contact with results via phone or letter.    Follow-Up: 3 months   Any Other Special Instructions Will Be Listed Below (If Applicable).  If you need a refill on your cardiac medications before your next appointment, please call your pharmacy.  

## 2018-12-30 NOTE — Telephone Encounter (Signed)
Pre-cert Verification for the following procedure    lexiscan scheduled for 01-03-2019 at [facility]

## 2019-01-03 ENCOUNTER — Other Ambulatory Visit: Payer: Self-pay

## 2019-01-03 ENCOUNTER — Ambulatory Visit (HOSPITAL_COMMUNITY)
Admission: RE | Admit: 2019-01-03 | Discharge: 2019-01-03 | Disposition: A | Discharge status: Home / Self Care | Payer: Medicare HMO | Source: Ambulatory Visit | Attending: Cardiovascular Disease | Admitting: Cardiovascular Disease

## 2019-01-03 ENCOUNTER — Encounter (HOSPITAL_COMMUNITY)
Admission: RE | Admit: 2019-01-03 | Discharge: 2019-01-03 | Disposition: A | Discharge status: Home / Self Care | Payer: Medicare HMO | Source: Ambulatory Visit | Attending: Cardiovascular Disease | Admitting: Cardiovascular Disease

## 2019-01-03 ENCOUNTER — Encounter (HOSPITAL_COMMUNITY): Payer: Self-pay

## 2019-01-03 DIAGNOSIS — R0789 Other chest pain: Secondary | ICD-10-CM | POA: Diagnosis not present

## 2019-01-03 LAB — NM MYOCAR MULTI W/SPECT W/WALL MOTION / EF
LV dias vol: 53 mL (ref 46–106)
LV sys vol: 12 mL
Peak HR: 105 {beats}/min
RATE: 0.39
Rest HR: 55 {beats}/min
SDS: 0
SRS: 0
SSS: 0
TID: 0.88

## 2019-01-03 MED ORDER — TECHNETIUM TC 99M TETROFOSMIN IV KIT
30.0000 | PACK | Freq: Once | INTRAVENOUS | Status: AC | PRN
  Administered 2019-01-03: 09:00:00 30 via INTRAVENOUS

## 2019-01-03 MED ORDER — TECHNETIUM TC 99M TETROFOSMIN IV KIT
10.0000 | PACK | Freq: Once | INTRAVENOUS | Status: AC | PRN
  Administered 2019-01-03: 08:00:00 10.5 via INTRAVENOUS

## 2019-01-03 MED ORDER — REGADENOSON 0.4 MG/5ML IV SOLN
INTRAVENOUS | Status: AC
  Administered 2019-01-03: 09:00:00 0.4 mg via INTRAVENOUS
  Filled 2019-01-03: qty 5

## 2019-01-03 MED ORDER — SODIUM CHLORIDE FLUSH 0.9 % IV SOLN
INTRAVENOUS | Status: AC
  Administered 2019-01-03: 09:00:00 10 mL via INTRAVENOUS
  Filled 2019-01-03: qty 10

## 2019-01-07 ENCOUNTER — Telehealth: Payer: Self-pay | Admitting: *Deleted

## 2019-01-07 NOTE — Telephone Encounter (Signed)
Notes recorded by Laurine Blazer, LPN on [id number] at [id number] AM EST  Patient notified. Copy to pmd. Follow up scheduled for 04/03/2019 with Dr. Bronson Ing (vv).    ------   Notes recorded by Herminio Commons, MD on 01/06/2019 at 8:47 AM EST  Normal. No evidence for blockages.

## 2019-01-08 ENCOUNTER — Encounter: Payer: Self-pay | Admitting: Orthopaedic Surgery

## 2019-01-08 ENCOUNTER — Ambulatory Visit (INDEPENDENT_AMBULATORY_CARE_PROVIDER_SITE_OTHER): Payer: Medicare HMO | Admitting: Orthopaedic Surgery

## 2019-01-08 ENCOUNTER — Other Ambulatory Visit: Payer: Self-pay

## 2019-01-08 VITALS — BP 117/54 | HR 62 | Ht 63.5 in | Wt 141.1 lb

## 2019-01-08 DIAGNOSIS — M25552 Pain in left hip: Secondary | ICD-10-CM | POA: Diagnosis not present

## 2019-01-08 NOTE — Progress Notes (Signed)
CC:  I am so much better  She has no pain of her left hip trochanteric bursa area and is walking well.  NV intact.  ROM of the left hip is full.  Encounter Diagnosis  Name Primary?  . Pain in left hip Yes   I will see as needed. Discharge.  Call if any problem.  Precautions discussed.   Electronically Signed Sanjuana Kava, MD 12/9/20209:06 AM

## 2019-01-30 DIAGNOSIS — I10 Essential (primary) hypertension: Secondary | ICD-10-CM | POA: Diagnosis not present

## 2019-01-30 DIAGNOSIS — E782 Mixed hyperlipidemia: Secondary | ICD-10-CM | POA: Diagnosis not present

## 2019-02-27 ENCOUNTER — Telehealth: Payer: Self-pay | Admitting: *Deleted

## 2019-02-27 NOTE — Telephone Encounter (Signed)
Received call from [facility] to report that patient has not been taking lisinopril 2.5 mg for some time but is unsure of duration. Per Claiborne Billings, patient is getting her medications packaged through [facility] Advised that pharmacy would be contacted to check last time lisinopril 2.5 mg was packed.   Per Janett Billow at [facility], lisinopril 2.5 mg was filled last on 02/17/2019 in a pill bottle per patient request. Per Janett Billow, patient has been refilling lisinopril 2.5 mg every month but did request that it is not pre-packaged with other medications.

## 2019-02-28 DIAGNOSIS — I10 Essential (primary) hypertension: Secondary | ICD-10-CM | POA: Diagnosis not present

## 2019-02-28 DIAGNOSIS — E559 Vitamin D deficiency, unspecified: Secondary | ICD-10-CM | POA: Diagnosis not present

## 2019-02-28 DIAGNOSIS — E7849 Other hyperlipidemia: Secondary | ICD-10-CM | POA: Diagnosis not present

## 2019-03-28 DIAGNOSIS — I10 Essential (primary) hypertension: Secondary | ICD-10-CM | POA: Diagnosis not present

## 2019-03-28 DIAGNOSIS — E7849 Other hyperlipidemia: Secondary | ICD-10-CM | POA: Diagnosis not present

## 2019-04-03 ENCOUNTER — Encounter: Payer: Self-pay | Admitting: Cardiovascular Disease

## 2019-04-03 ENCOUNTER — Telehealth (INDEPENDENT_AMBULATORY_CARE_PROVIDER_SITE_OTHER): Payer: Medicare HMO | Admitting: Cardiovascular Disease

## 2019-04-03 VITALS — BP 133/80 | HR 58

## 2019-04-03 DIAGNOSIS — Z955 Presence of coronary angioplasty implant and graft: Secondary | ICD-10-CM | POA: Diagnosis not present

## 2019-04-03 DIAGNOSIS — I10 Essential (primary) hypertension: Secondary | ICD-10-CM | POA: Diagnosis not present

## 2019-04-03 DIAGNOSIS — E785 Hyperlipidemia, unspecified: Secondary | ICD-10-CM | POA: Diagnosis not present

## 2019-04-03 DIAGNOSIS — I25118 Atherosclerotic heart disease of native coronary artery with other forms of angina pectoris: Secondary | ICD-10-CM

## 2019-04-03 NOTE — Patient Instructions (Signed)
Medication Instructions:  Continue all current medications.   Labwork: none  Testing/Procedures: none  Follow-Up: 6 months   Any Other Special Instructions Will Be Listed Below (If Applicable).   If you need a refill on your cardiac medications before your next appointment, please call your pharmacy.  

## 2019-04-03 NOTE — Progress Notes (Addendum)
Virtual Visit via Telephone Note   This visit type was conducted due to national recommendations for restrictions regarding the COVID-19 Pandemic (e.g. social distancing) in an effort to limit this patient's exposure and mitigate transmission in our community.  Due to her co-morbid illnesses, this patient is at least at moderate risk for complications without adequate follow up.  This format is felt to be most appropriate for this patient at this time.  The patient did not have access to video technology/had technical difficulties with video requiring transitioning to audio format only (telephone).  All issues noted in this document were discussed and addressed.  No physical exam could be performed with this format.  Please refer to the patient's chart for her  consent to telehealth for [facility].   Date:  04/03/2019   ID:  Tasha Peters, Tasha Peters 01/29/1941, MRN [id number]  Patient Location: Home Provider Location: Office  PCP:  Medicine, [facility]  Cardiologist:  Kate Sable, MD  Electrophysiologist:  None   Evaluation Performed:  Follow-Up Visit  Chief Complaint:  CAD  History of Present Illness:    Tasha Peters is a 79 y.o. female with a history of coronary artery disease with stent placement on 10/27/2015 in [place], [place].  She seldom has chest tightness and this only appears to occur when she is walking upstairs.  You underwent a normal nuclear stress test in December 2020.  She denies leg swelling and palpitations.  The patient does not have symptoms concerning for COVID-19 infection (fever, chills, cough, or new shortness of breath).    Past Medical History:  Diagnosis Date  . Acid reflux   . Hypercholesterolemia   . Hypertension    Past Surgical History:  Procedure Laterality Date  . ABDOMINAL HYSTERECTOMY  1979  . CARPAL TUNNEL RELEASE  03/2010, 03/2010   both wrists  . colonoscopy  08/31/2004  . ESOPHAGOGASTRODUODENOSCOPY  01/04/2001, 04/20/1999,  08/25/1997  . NISSEN FUNDOPLICATION  [id number] & 10/21/1997  . TONSILLECTOMY    . TUBAL LIGATION  1972  . TYMPANOPLASTY  1964     No outpatient medications have been marked as taking for the 04/03/19 encounter (Telemedicine) with Herminio Commons, MD.     Allergies:   Lactose intolerance (gi) and Codeine   Social History   Tobacco Use  . Smoking status: Never Smoker  . Smokeless tobacco: Never Used  Substance Use Topics  . Alcohol use: No  . Drug use: No     Family Hx: The patient's family history includes Heart attack in her father and mother; Heart failure in her mother.  ROS:   Please see the history of present illness.     All other systems reviewed and are negative.   Prior CV studies:   The following studies were reviewed today:  Nuclear stress test 01/03/2019:   There was no ST segment deviation noted during stress.  The study is normal. There are no perfusion defects  This is a low risk study.  The left ventricular ejection fraction is hyperdynamic (>65%).     Labs/Other Tests and Data Reviewed:    EKG:  No ECG reviewed.  Recent Labs: No results found for requested labs within last 8760 hours.   Recent Lipid Panel Lab Results  Component Value Date/Time   CHOL 243 (H) 12/25/2007 07:37 PM   TRIG 247 (H) 12/25/2007 07:37 PM   HDL 44 12/25/2007 07:37 PM   CHOLHDL 5.5 Ratio 12/25/2007 07:37 PM   LDLCALC 150 (H)  12/25/2007 07:37 PM    Wt Readings from Last 3 Encounters:  01/08/19 141 lb 2 oz (64 kg)  12/30/18 139 lb (63 kg)  12/11/18 135 lb (61.2 kg)     Objective:    Vital Signs:  BP 133/80   Pulse (!) 58    VITAL SIGNS:  reviewed  ASSESSMENT & PLAN:    1. Coronary artery disease: History of stent placement on 10/27/2015.  Catheterization was performed in [place], [place].  Normal nuclear stress test in December 2020.  Continue medical therapy with aspirin, atorvastatin, metoprolol.  2.  Hypertension: BP is normal.  No changes to  therapy.  3.  Hyperlipidemia: Currently on atorvastatin 80 mg daily.  I will obtain a copy of lipids from PCP.    COVID-19 Education: The signs and symptoms of COVID-19 were discussed with the patient and how to seek care for testing (follow up with PCP or arrange E-visit).  The importance of social distancing was discussed today.  Time:   Today, I have spent 15 minutes with the patient with telehealth technology discussing the above problems.     Medication Adjustments/Labs and Tests Ordered: Current medicines are reviewed at length with the patient today.  Concerns regarding medicines are outlined above.   Tests Ordered: No orders of the defined types were placed in this encounter.   Medication Changes: No orders of the defined types were placed in this encounter.   Follow Up:  Virtual Visit  in 6 month(s)  Signed, Kate Sable, MD  04/03/2019 11:31 AM    [place]

## 2019-04-08 DIAGNOSIS — Z6824 Body mass index (BMI) 24.0-24.9, adult: Secondary | ICD-10-CM | POA: Diagnosis not present

## 2019-04-08 DIAGNOSIS — I2510 Atherosclerotic heart disease of native coronary artery without angina pectoris: Secondary | ICD-10-CM | POA: Diagnosis not present

## 2019-04-08 DIAGNOSIS — M47816 Spondylosis without myelopathy or radiculopathy, lumbar region: Secondary | ICD-10-CM | POA: Diagnosis not present

## 2019-04-08 DIAGNOSIS — M5136 Other intervertebral disc degeneration, lumbar region: Secondary | ICD-10-CM | POA: Diagnosis not present

## 2019-04-08 DIAGNOSIS — I10 Essential (primary) hypertension: Secondary | ICD-10-CM | POA: Diagnosis not present

## 2019-04-08 DIAGNOSIS — Z955 Presence of coronary angioplasty implant and graft: Secondary | ICD-10-CM | POA: Diagnosis not present

## 2019-04-11 DIAGNOSIS — M545 Low back pain: Secondary | ICD-10-CM | POA: Diagnosis not present

## 2019-04-15 ENCOUNTER — Encounter: Payer: Self-pay | Admitting: Orthopaedic Surgery

## 2019-04-15 ENCOUNTER — Ambulatory Visit: Payer: Medicare HMO

## 2019-04-15 ENCOUNTER — Other Ambulatory Visit: Payer: Self-pay

## 2019-04-15 ENCOUNTER — Ambulatory Visit: Payer: Medicare HMO | Admitting: Orthopaedic Surgery

## 2019-04-15 ENCOUNTER — Telehealth: Payer: Self-pay | Admitting: Orthopedic Surgery

## 2019-04-15 VITALS — BP 127/67 | HR 67 | Ht 63.5 in | Wt 138.0 lb

## 2019-04-15 DIAGNOSIS — M545 Low back pain, unspecified: Secondary | ICD-10-CM

## 2019-04-15 NOTE — Telephone Encounter (Signed)
Faxed request for continuity of care (including imaging) regarding patient's heart stent, as she states she cannot find her card. Noted sent to [facility] to fax# [phone] / ph# [phone]; patient aware she may still need to sign a release form if continuity of care request is not accepted without a signed release form.

## 2019-04-15 NOTE — Progress Notes (Signed)
Patient Tasha Peters:[id number] C [place], female DOB:25-Mar-1940, 79 y.o. [id number]  Chief Complaint  Patient presents with  . Back Pain    HPI  Tasha Peters is a 79 y.o. female who has increasing lower back pain over the last two weeks.  It is in the upper lumbar area.  She denies any falls or trauma.  She has had pain in the back on and off for over a year but much worse recently.  I have reviewed her notes from [facility].  She has no weakness, no numbness.     Body mass index is 24.06 kg/m.  ROS  Review of Systems  Constitutional: Positive for activity change.  Musculoskeletal: Positive for arthralgias, back pain, gait problem and myalgias.  All other systems reviewed and are negative.   All other systems reviewed and are negative.  The following is a summary of the past history medically, past history surgically, known current medicines, social history and family history.  This information is gathered electronically by the computer from prior information and documentation.  I review this each visit and have found including this information at this point in the chart is beneficial and informative.    Past Medical History:  Diagnosis Date  . Acid reflux   . Hypercholesterolemia   . Hypertension     Past Surgical History:  Procedure Laterality Date  . ABDOMINAL HYSTERECTOMY  1979  . CARPAL TUNNEL RELEASE  03/2010, 03/2010   both wrists  . colonoscopy  08/31/2004  . ESOPHAGOGASTRODUODENOSCOPY  01/04/2001, 04/20/1999, 08/25/1997  . NISSEN FUNDOPLICATION  [id number] & 10/21/1997  . TONSILLECTOMY    . TUBAL LIGATION  1972  . TYMPANOPLASTY  1964    Family History  Problem Relation Age of Onset  . Heart attack Mother   . Heart failure Mother   . Heart attack Father     Social History Social History   Tobacco Use  . Smoking status: Never Smoker  . Smokeless tobacco: Never Used  Substance Use Topics  . Alcohol use: No  . Drug use: No    Allergies  Allergen Reactions  .  Lactose Intolerance (Gi)   . Codeine Itching and Nausea And Vomiting    Current Outpatient Medications  Medication Sig Dispense Refill  . aspirin 81 MG tablet Take 81 mg by mouth daily.    Marland Kitchen atorvastatin (LIPITOR) 80 MG tablet Take 80 mg by mouth daily.    . calcium carbonate (CALCIUM 600) 600 MG TABS tablet Take 600 mg by mouth 2 (two) times daily with a meal.    . lisinopril (PRINIVIL,ZESTRIL) 2.5 MG tablet Take 2.5 mg by mouth daily.    . metoprolol (LOPRESSOR) 50 MG tablet Take 50 mg by mouth 2 (two) times daily.    . pantoprazole (PROTONIX) 20 MG tablet Take 20 mg by mouth daily.     No current facility-administered medications for this visit.     Physical Exam  Blood pressure 127/67, pulse 67, height 5' 3.5" (1.613 m), weight 138 lb (62.6 kg).  Constitutional: overall normal hygiene, normal nutrition, well developed, normal grooming, normal body habitus. Assistive device:none  Musculoskeletal: gait and station Limp none, muscle tone and strength are normal, no tremors or atrophy is present.  .  Neurological: coordination overall normal.  Deep tendon reflex/nerve stretch intact.  Sensation normal.  Cranial nerves II-XII intact.   Skin:   Normal overall no scars, lesions, ulcers or rashes. No psoriasis.  Psychiatric: Alert and oriented x 3.  Recent memory intact,  remote memory unclear.  Normal mood and affect. Well groomed.  Good eye contact.  Cardiovascular: overall no swelling, no varicosities, no edema bilaterally, normal temperatures of the legs and arms, no clubbing, cyanosis and good capillary refill.  Lymphatic: palpation is normal.  Spine/Pelvis examination:  Inspection:  Overall, sacoiliac joint benign and hips nontender; without crepitus or defects.   Thoracic spine inspection: Alignment normal without kyphosis present   Lumbar spine inspection:  Alignment  with normal lumbar lordosis, without scoliosis apparent.   Thoracic spine palpation:  without tenderness  of spinal processes   Lumbar spine palpation: without tenderness of lumbar area; without tightness of lumbar muscles    Range of Motion:   Lumbar flexion, forward flexion is abnormal with pain and tenderness of T12-L1 area.    Lumbar extension is full without pain or tenderness   Left lateral bend is normal without pain or tenderness   Right lateral bend is normal without pain or tenderness   Straight leg raising is normal  Strength & tone: normal   Stability overall normal stability  All other systems reviewed and are negative   The patient has been educated about the nature of the problem(s) and counseled on treatment options.  The patient appeared to understand what I have discussed and is in agreement with it.  Encounter Diagnosis  Name Primary?  . Lumbar back pain Yes  X-rays were done of the lumbar spine, reported separately.  She has compression of L1 mild, age undetermined.  I will get MRI of the lumbar spine.  PLAN Call if any problems.  Precautions discussed.  Continue current medications.   Return to clinic 2 weeks   Get MRI of the lumbar spine  Electronically Signed Sanjuana Kava, MD 3/16/202110:41 AM

## 2019-04-16 DIAGNOSIS — Z7982 Long term (current) use of aspirin: Secondary | ICD-10-CM | POA: Diagnosis not present

## 2019-04-16 DIAGNOSIS — G8929 Other chronic pain: Secondary | ICD-10-CM | POA: Diagnosis not present

## 2019-04-16 DIAGNOSIS — G4700 Insomnia, unspecified: Secondary | ICD-10-CM | POA: Diagnosis not present

## 2019-04-16 DIAGNOSIS — I739 Peripheral vascular disease, unspecified: Secondary | ICD-10-CM | POA: Diagnosis not present

## 2019-04-16 DIAGNOSIS — K219 Gastro-esophageal reflux disease without esophagitis: Secondary | ICD-10-CM | POA: Diagnosis not present

## 2019-04-16 DIAGNOSIS — Z008 Encounter for other general examination: Secondary | ICD-10-CM | POA: Diagnosis not present

## 2019-04-16 DIAGNOSIS — I10 Essential (primary) hypertension: Secondary | ICD-10-CM | POA: Diagnosis not present

## 2019-04-16 DIAGNOSIS — E785 Hyperlipidemia, unspecified: Secondary | ICD-10-CM | POA: Diagnosis not present

## 2019-04-16 DIAGNOSIS — R32 Unspecified urinary incontinence: Secondary | ICD-10-CM | POA: Diagnosis not present

## 2019-04-16 DIAGNOSIS — I2510 Atherosclerotic heart disease of native coronary artery without angina pectoris: Secondary | ICD-10-CM | POA: Diagnosis not present

## 2019-04-29 ENCOUNTER — Other Ambulatory Visit: Payer: Self-pay

## 2019-04-29 ENCOUNTER — Ambulatory Visit (HOSPITAL_COMMUNITY)
Admission: RE | Admit: 2019-04-29 | Discharge: 2019-04-29 | Disposition: A | Discharge status: Home / Self Care | Payer: Medicare HMO | Source: Ambulatory Visit | Attending: Orthopaedic Surgery | Admitting: Orthopaedic Surgery

## 2019-04-29 DIAGNOSIS — M545 Low back pain, unspecified: Secondary | ICD-10-CM

## 2019-04-30 DIAGNOSIS — I2510 Atherosclerotic heart disease of native coronary artery without angina pectoris: Secondary | ICD-10-CM | POA: Diagnosis not present

## 2019-04-30 DIAGNOSIS — I10 Essential (primary) hypertension: Secondary | ICD-10-CM | POA: Diagnosis not present

## 2019-05-01 ENCOUNTER — Other Ambulatory Visit: Payer: Self-pay

## 2019-05-01 ENCOUNTER — Ambulatory Visit (INDEPENDENT_AMBULATORY_CARE_PROVIDER_SITE_OTHER): Payer: Medicare HMO | Admitting: Orthopaedic Surgery

## 2019-05-01 ENCOUNTER — Encounter: Payer: Self-pay | Admitting: Orthopaedic Surgery

## 2019-05-01 VITALS — BP 119/66 | HR 60 | Temp 97.2°F | Ht 63.5 in | Wt 138.0 lb

## 2019-05-01 DIAGNOSIS — M545 Low back pain, unspecified: Secondary | ICD-10-CM

## 2019-05-01 NOTE — Progress Notes (Signed)
Patient Tasha Peters:[id number] C [place], female DOB:1940/12/12, 79 y.o. [id number]  Chief Complaint  Patient presents with  . Back Pain    Lumbar back pain.    HPI  HARJIT BURKART is a 79 y.o. female who has lower back pain.  She has a tight spot in her lower back on the left side.  She had MRI which showed: IMPRESSION: 1. Mild lumbar spondylosis, as described above. Findings slightly progressed at the L2-L3 through L4-L5 levels. 2. Mild-to-moderate left foraminal stenosis at L4-L5.  I have explained the findings to her.  I will begin PT for her in [place].   Body mass index is 24.06 kg/m.  ROS  Review of Systems  Constitutional: Positive for activity change.  Musculoskeletal: Positive for arthralgias, back pain, gait problem and myalgias.  All other systems reviewed and are negative.   All other systems reviewed and are negative.  The following is a summary of the past history medically, past history surgically, known current medicines, social history and family history.  This information is gathered electronically by the computer from prior information and documentation.  I review this each visit and have found including this information at this point in the chart is beneficial and informative.    Past Medical History:  Diagnosis Date  . Acid reflux   . Hypercholesterolemia   . Hypertension     Past Surgical History:  Procedure Laterality Date  . ABDOMINAL HYSTERECTOMY  1979  . CARPAL TUNNEL RELEASE  03/2010, 03/2010   both wrists  . colonoscopy  08/31/2004  . ESOPHAGOGASTRODUODENOSCOPY  01/04/2001, 04/20/1999, 08/25/1997  . NISSEN FUNDOPLICATION  [id number] & 10/21/1997  . TONSILLECTOMY    . TUBAL LIGATION  1972  . TYMPANOPLASTY  1964    Family History  Problem Relation Age of Onset  . Heart attack Mother   . Heart failure Mother   . Heart attack Father     Social History Social History   Tobacco Use  . Smoking status: Never Smoker  . Smokeless tobacco: Never Used   Substance Use Topics  . Alcohol use: No  . Drug use: No    Allergies  Allergen Reactions  . Lactose Intolerance (Gi)   . Codeine Itching and Nausea And Vomiting    Current Outpatient Medications  Medication Sig Dispense Refill  . aspirin 81 MG tablet Take 81 mg by mouth daily.    Marland Kitchen atorvastatin (LIPITOR) 80 MG tablet Take 80 mg by mouth daily.    . calcium carbonate (CALCIUM 600) 600 MG TABS tablet Take 600 mg by mouth 2 (two) times daily with a meal.    . lisinopril (PRINIVIL,ZESTRIL) 2.5 MG tablet Take 2.5 mg by mouth daily.    . metoprolol (LOPRESSOR) 50 MG tablet Take 50 mg by mouth 2 (two) times daily.    . pantoprazole (PROTONIX) 20 MG tablet Take 20 mg by mouth daily.     No current facility-administered medications for this visit.     Physical Exam  Blood pressure 119/66, pulse 60, temperature (!) 97.2 F (36.2 C), height 5' 3.5" (1.613 m), weight 138 lb (62.6 kg).  Constitutional: overall normal hygiene, normal nutrition, well developed, normal grooming, normal body habitus. Assistive device:none  Musculoskeletal: gait and station Limp none, muscle tone and strength are normal, no tremors or atrophy is present.  .  Neurological: coordination overall normal.  Deep tendon reflex/nerve stretch intact.  Sensation normal.  Cranial nerves II-XII intact.   Skin:   Normal overall no scars, lesions,  ulcers or rashes. No psoriasis.  Psychiatric: Alert and oriented x 3.  Recent memory intact, remote memory unclear.  Normal mood and affect. Well groomed.  Good eye contact.  Cardiovascular: overall no swelling, no varicosities, no edema bilaterally, normal temperatures of the legs and arms, no clubbing, cyanosis and good capillary refill.  Lymphatic: palpation is normal.  Spine/Pelvis examination:  Inspection:  Overall, sacoiliac joint benign and hips nontender; without crepitus or defects.   Thoracic spine inspection: Alignment normal without kyphosis present   Lumbar  spine inspection:  Alignment  with normal lumbar lordosis, without scoliosis apparent.   Thoracic spine palpation:  without tenderness of spinal processes   Lumbar spine palpation: without tenderness of lumbar area; without tightness of lumbar muscles    Range of Motion:   Lumbar flexion, forward flexion is normal without pain or tenderness    Lumbar extension is full without pain or tenderness   Left lateral bend is normal without pain or tenderness   Right lateral bend is normal without pain or tenderness   Straight leg raising is normal  Strength & tone: normal   Stability overall normal stability  All other systems reviewed and are negative   The patient has been educated about the nature of the problem(s) and counseled on treatment options.  The patient appeared to understand what I have discussed and is in agreement with it.  Encounter Diagnosis  Name Primary?  . Lumbar back pain Yes    PLAN Call if any problems.  Precautions discussed.  Continue current medications.   Return to clinic 1 month  Begin PT   Electronically Signed Sanjuana Kava, MD 4/1/20218:53 AM

## 2019-05-05 DIAGNOSIS — S233XXA Sprain of ligaments of thoracic spine, initial encounter: Secondary | ICD-10-CM | POA: Diagnosis not present

## 2019-05-07 ENCOUNTER — Encounter: Payer: Self-pay | Admitting: Orthopaedic Surgery

## 2019-05-07 ENCOUNTER — Other Ambulatory Visit: Payer: Self-pay

## 2019-05-07 ENCOUNTER — Ambulatory Visit (INDEPENDENT_AMBULATORY_CARE_PROVIDER_SITE_OTHER): Payer: Medicare HMO | Admitting: Orthopaedic Surgery

## 2019-05-07 DIAGNOSIS — S233XXA Sprain of ligaments of thoracic spine, initial encounter: Secondary | ICD-10-CM | POA: Diagnosis not present

## 2019-05-07 DIAGNOSIS — M545 Low back pain, unspecified: Secondary | ICD-10-CM

## 2019-05-07 DIAGNOSIS — G8929 Other chronic pain: Secondary | ICD-10-CM | POA: Diagnosis not present

## 2019-05-07 NOTE — Progress Notes (Signed)
Office Visit Note   Patient: Tasha Peters           Date of Birth: 08-05-1940           MRN: [id number] Visit Date: 05/07/2019              Requested by: Monico Blitz, MD [address] [place],  [place] [address] PCP: Monico Blitz, MD   Assessment & Plan: Visit Diagnoses:  1. Chronic bilateral low back pain without sciatica     Plan: [name] is seen today for follow-up evaluation of her low back pain.  She saw Dr. Luna Glasgow last week and started a course of physical therapy this week.  She is having predominately midline low back pain.  She had an MRI scan performed on March 30.  This revealed mild lumbar spondylosis they have slightly progressed at L2-3 through L4-5.  In addition she had mild to moderate left foraminal stenosis at L4-5.  [name] relates that she is not having any appreciable leg pain.  She does use ice and heat and occasional Tylenol and notes that that seems to help.  Long discussion today over 30 minutes regarding her diagnosis.  I think therapy is an excellent modality.  We discussed the continued use of Tylenol limiting to no more than 3000 mg a day.  She may continue with the ice and heat.  Would like to see her back in 4 to 6 weeks and monitor progress.  I have discussed facet joint injections but she is somewhat hesitant to think about that right now  Follow-Up Instructions: Return in about 6 weeks (around 06/18/2019).   Orders:  No orders of the defined types were placed in this encounter.  No orders of the defined types were placed in this encounter.     Procedures: No procedures performed   Clinical Data: No additional findings.   Subjective: Chief Complaint  Patient presents with  . Lower Back - Pain  Patient presents today for lower back pain. She states that she has been hurting for a year. No known injury. The pain started at the same time she was moving and picking up boxes. Her pain stays in her lower back and does not travel down her buttock and  lower extremities. The pain is worse with any weightbearing activity, but gets better with rest. No numbness, tingling, or weakness in her lower extremities. She takes Tylenol Arthritis for pain. No previous back surgery or treatment. She states that she has seen Dr.Keeling and had an MRI at [facility] two weeks ago.   HPI  Review of Systems   Objective: Vital Signs: Ht 5' 3.5" (1.613 m)   Wt 148 lb (67.1 kg)   BMI 25.81 kg/m   Physical Exam Constitutional:      Appearance: She is well-developed.  Eyes:     Pupils: Pupils are equal, round, and reactive to light.  Pulmonary:     Effort: Pulmonary effort is normal.  Skin:    General: Skin is warm and dry.  Neurological:     Mental Status: She is alert and oriented to person, place, and time.  Psychiatric:        Behavior: Behavior normal.     Ortho Exam awake alert and oriented x3.  Comfortable sitting and walking.  Straight leg raise Was Negative on the Right Slightly Positive on the Left thigh pain.  Her hamstring seemed a little tight.  Good strength in both lower extremities motor and  sensory exam intact.  Painless range of motion both hips.  Very minimal percussible soreness in her lumbar spine at the lumbosacral junction.  No proximal lumbar or distal thoracic pain.  She does have a right-sided scoliosis  Specialty Comments:  No specialty comments available.  Imaging: No results found.   PMFS History: Patient Active Problem List   Diagnosis Date Noted  . Low back pain 05/07/2019  . THORACIC/LUMBOSACRAL NEURITIS/RADICULITIS UNSPEC 07/04/2007  . HYPERLIPIDEMIA 02/18/2007  . GERD 02/18/2007  . PEPTIC ULCER DISEASE 02/18/2007  . IBS 02/18/2007  . ARTHRITIS 02/18/2007  . DEGENERATIVE DISC DISEASE, CERVICAL SPINE 02/18/2007  . MALAISE AND FATIGUE 02/18/2007   Past Medical History:  Diagnosis Date  . Acid reflux   . Hypercholesterolemia   . Hypertension     Family History  Problem Relation Age of Onset  .  Heart attack Mother   . Heart failure Mother   . Heart attack Father     Past Surgical History:  Procedure Laterality Date  . ABDOMINAL HYSTERECTOMY  1979  . CARPAL TUNNEL RELEASE  03/2010, 03/2010   both wrists  . colonoscopy  08/31/2004  . ESOPHAGOGASTRODUODENOSCOPY  01/04/2001, 04/20/1999, 08/25/1997  . NISSEN FUNDOPLICATION  [id number] & 10/21/1997  . TONSILLECTOMY    . TUBAL LIGATION  1972  . TYMPANOPLASTY  1964   Social History   Occupational History  . Not on file  Tobacco Use  . Smoking status: Never Smoker  . Smokeless tobacco: Never Used  Substance and Sexual Activity  . Alcohol use: No  . Drug use: No  . Sexual activity: Not on file

## 2019-05-12 DIAGNOSIS — S233XXA Sprain of ligaments of thoracic spine, initial encounter: Secondary | ICD-10-CM | POA: Diagnosis not present

## 2019-05-14 DIAGNOSIS — S233XXA Sprain of ligaments of thoracic spine, initial encounter: Secondary | ICD-10-CM | POA: Diagnosis not present

## 2019-05-21 DIAGNOSIS — S233XXA Sprain of ligaments of thoracic spine, initial encounter: Secondary | ICD-10-CM | POA: Diagnosis not present

## 2019-05-28 ENCOUNTER — Other Ambulatory Visit: Payer: Self-pay

## 2019-05-28 ENCOUNTER — Ambulatory Visit: Payer: Medicare HMO | Admitting: Orthopaedic Surgery

## 2019-05-30 DIAGNOSIS — I10 Essential (primary) hypertension: Secondary | ICD-10-CM | POA: Diagnosis not present

## 2019-05-30 DIAGNOSIS — E559 Vitamin D deficiency, unspecified: Secondary | ICD-10-CM | POA: Diagnosis not present

## 2019-05-30 DIAGNOSIS — E7849 Other hyperlipidemia: Secondary | ICD-10-CM | POA: Diagnosis not present

## 2019-06-10 DIAGNOSIS — E559 Vitamin D deficiency, unspecified: Secondary | ICD-10-CM | POA: Diagnosis not present

## 2019-06-10 DIAGNOSIS — K219 Gastro-esophageal reflux disease without esophagitis: Secondary | ICD-10-CM | POA: Diagnosis not present

## 2019-06-10 DIAGNOSIS — E782 Mixed hyperlipidemia: Secondary | ICD-10-CM | POA: Diagnosis not present

## 2019-06-10 DIAGNOSIS — I10 Essential (primary) hypertension: Secondary | ICD-10-CM | POA: Diagnosis not present

## 2019-06-13 DIAGNOSIS — I2510 Atherosclerotic heart disease of native coronary artery without angina pectoris: Secondary | ICD-10-CM | POA: Diagnosis not present

## 2019-06-13 DIAGNOSIS — M5136 Other intervertebral disc degeneration, lumbar region: Secondary | ICD-10-CM | POA: Diagnosis not present

## 2019-06-13 DIAGNOSIS — M8580 Other specified disorders of bone density and structure, unspecified site: Secondary | ICD-10-CM | POA: Diagnosis not present

## 2019-06-13 DIAGNOSIS — I10 Essential (primary) hypertension: Secondary | ICD-10-CM | POA: Diagnosis not present

## 2019-06-13 DIAGNOSIS — Z6824 Body mass index (BMI) 24.0-24.9, adult: Secondary | ICD-10-CM | POA: Diagnosis not present

## 2019-06-13 DIAGNOSIS — E782 Mixed hyperlipidemia: Secondary | ICD-10-CM | POA: Diagnosis not present

## 2019-06-13 DIAGNOSIS — R69 Illness, unspecified: Secondary | ICD-10-CM | POA: Diagnosis not present

## 2019-06-30 DIAGNOSIS — K219 Gastro-esophageal reflux disease without esophagitis: Secondary | ICD-10-CM | POA: Diagnosis not present

## 2019-06-30 DIAGNOSIS — I10 Essential (primary) hypertension: Secondary | ICD-10-CM | POA: Diagnosis not present

## 2019-06-30 DIAGNOSIS — E7849 Other hyperlipidemia: Secondary | ICD-10-CM | POA: Diagnosis not present

## 2019-07-02 ENCOUNTER — Ambulatory Visit (INDEPENDENT_AMBULATORY_CARE_PROVIDER_SITE_OTHER): Payer: Medicare HMO | Admitting: Orthopaedic Surgery

## 2019-07-02 ENCOUNTER — Other Ambulatory Visit: Payer: Self-pay

## 2019-07-02 ENCOUNTER — Encounter: Payer: Self-pay | Admitting: Orthopaedic Surgery

## 2019-07-02 VITALS — Ht 63.5 in | Wt 142.0 lb

## 2019-07-02 DIAGNOSIS — G8929 Other chronic pain: Secondary | ICD-10-CM | POA: Diagnosis not present

## 2019-07-02 DIAGNOSIS — M545 Low back pain: Secondary | ICD-10-CM | POA: Diagnosis not present

## 2019-07-02 NOTE — Progress Notes (Signed)
Office Visit Note   Patient: Tasha Peters           Date of Birth: [birth year]           MRN: [id number] Visit Date: 07/02/2019              Requested by: Monico Blitz, MD [address] [place],  [place] [address] PCP: Monico Blitz, MD   Assessment & Plan: Visit Diagnoses:  1. Chronic bilateral low back pain without sciatica     Plan: [name] relates she is having little if any discomfort in the lumbar spine after course of physical therapy.  She denies any bowel or bladder dysfunction or lower extremity pain.  We have had a long discussion regarding persistent exercises and returning as needed.  Not taking any medicines for her back at the present time  Follow-Up Instructions: Return if symptoms worsen or fail to improve.   Orders:  No orders of the defined types were placed in this encounter.  No orders of the defined types were placed in this encounter.     Procedures: No procedures performed   Clinical Data: No additional findings.   Subjective: Chief Complaint  Patient presents with  . Lower Back - Follow-up  Patient presents today for a two month follow up her lower back. She states that she is doing well. She has finished physical therapy. She does home exercises as needed.  HPI  Review of Systems   Objective: Vital Signs: Ht 5' 3.5" (1.613 m)   Wt 142 lb (64.4 kg)   BMI 24.76 kg/m   Physical Exam Constitutional:      Appearance: She is well-developed.  Eyes:     Pupils: Pupils are equal, round, and reactive to light.  Pulmonary:     Effort: Pulmonary effort is normal.  Skin:    General: Skin is warm and dry.  Neurological:     Mental Status: She is alert and oriented to person, place, and time.  Psychiatric:        Behavior: Behavior normal.     Ortho Exam awake alert and oriented x3.  Comfortable sitting.  Not using any ambulatory aid.  Straight leg raise is negative bilaterally.  Motor exam intact.  No percussible tenderness of lumbar spine.   Walks with a normal gait  Specialty Comments:  No specialty comments available.  Imaging: No results found.   PMFS History: Patient Active Problem List   Diagnosis Date Noted  . Low back pain 05/07/2019  . THORACIC/LUMBOSACRAL NEURITIS/RADICULITIS UNSPEC 07/04/2007  . HYPERLIPIDEMIA 02/18/2007  . GERD 02/18/2007  . PEPTIC ULCER DISEASE 02/18/2007  . IBS 02/18/2007  . ARTHRITIS 02/18/2007  . DEGENERATIVE DISC DISEASE, CERVICAL SPINE 02/18/2007  . MALAISE AND FATIGUE 02/18/2007   Past Medical History:  Diagnosis Date  . Acid reflux   . Hypercholesterolemia   . Hypertension     Family History  Problem Relation Age of Onset  . Heart attack Mother   . Heart failure Mother   . Heart attack Father     Past Surgical History:  Procedure Laterality Date  . ABDOMINAL HYSTERECTOMY  1979  . CARPAL TUNNEL RELEASE  03/2010, 03/2010   both wrists  . colonoscopy  08/31/2004  . ESOPHAGOGASTRODUODENOSCOPY  01/04/2001, 04/20/1999, 08/25/1997  . NISSEN FUNDOPLICATION  [id number] & 10/21/1997  . TONSILLECTOMY    . TUBAL LIGATION  1972  . TYMPANOPLASTY  1964   Social History   Occupational History  . Not on file  Tobacco Use  . Smoking status: Never Smoker  . Smokeless tobacco: Never Used  Substance and Sexual Activity  . Alcohol use: No  . Drug use: No  . Sexual activity: Not on file

## 2019-07-16 ENCOUNTER — Telehealth: Payer: Self-pay | Admitting: *Deleted

## 2019-07-16 NOTE — Telephone Encounter (Signed)
Refill request received for nitroglycerin 0.4 mg but its not on medication profile. Please advise.

## 2019-07-17 ENCOUNTER — Other Ambulatory Visit: Payer: Self-pay | Admitting: *Deleted

## 2019-07-17 MED ORDER — NITROGLYCERIN 0.4 MG SL SUBL: 0.4000 mg | SUBLINGUAL_TABLET | SUBLINGUAL | 3 refills | Status: AC | PRN

## 2019-07-17 MED ORDER — NITROGLYCERIN 0.4 MG SL SUBL
0.4000 mg | SUBLINGUAL_TABLET | SUBLINGUAL | 3 refills | Status: DC | PRN
Start: 2019-07-17 — End: 2019-07-17

## 2019-07-17 NOTE — Telephone Encounter (Signed)
Ok to refill nitroglycerin  Zandra Abts MD

## 2019-07-30 DIAGNOSIS — K219 Gastro-esophageal reflux disease without esophagitis: Secondary | ICD-10-CM | POA: Diagnosis not present

## 2019-07-30 DIAGNOSIS — I10 Essential (primary) hypertension: Secondary | ICD-10-CM | POA: Diagnosis not present

## 2019-07-30 DIAGNOSIS — E7849 Other hyperlipidemia: Secondary | ICD-10-CM | POA: Diagnosis not present

## 2019-08-14 DIAGNOSIS — R600 Localized edema: Secondary | ICD-10-CM | POA: Diagnosis not present

## 2019-08-14 DIAGNOSIS — Z6824 Body mass index (BMI) 24.0-24.9, adult: Secondary | ICD-10-CM | POA: Diagnosis not present

## 2019-08-29 DIAGNOSIS — K219 Gastro-esophageal reflux disease without esophagitis: Secondary | ICD-10-CM | POA: Diagnosis not present

## 2019-08-29 DIAGNOSIS — I10 Essential (primary) hypertension: Secondary | ICD-10-CM | POA: Diagnosis not present

## 2019-08-29 DIAGNOSIS — E7849 Other hyperlipidemia: Secondary | ICD-10-CM | POA: Diagnosis not present

## 2019-09-30 DIAGNOSIS — K219 Gastro-esophageal reflux disease without esophagitis: Secondary | ICD-10-CM | POA: Diagnosis not present

## 2019-09-30 DIAGNOSIS — I10 Essential (primary) hypertension: Secondary | ICD-10-CM | POA: Diagnosis not present

## 2019-09-30 DIAGNOSIS — E7849 Other hyperlipidemia: Secondary | ICD-10-CM | POA: Diagnosis not present

## 2019-10-09 ENCOUNTER — Ambulatory Visit: Payer: Medicare HMO | Admitting: Cardiovascular Disease

## 2019-10-12 NOTE — Progress Notes (Signed)
Cardiology Office Note  Date: 10/13/2019   ID: Darren, Nodal 01-06-1941, MRN [id number]  PCP:  Curlene Labrum, MD  Cardiologist:  Kate Sable, MD (Inactive) Electrophysiologist:  None   Chief Complaint: CAD, shortness of breath on exertion.  History of Present Illness: Tasha Peters is a 79 y.o. female with a history of CAD, HTN, HLD.  History of stent placement prox LAD 10/27/2015.  Normal nuclear stress test December 2020.  Last encounter with Dr. Bronson Ing 04/03/2019 via telemedicine visit.  Was seldom having chest tightness and only appeared to occur when walking upstairs.  Previous abnormal stress test December 2020.  No symptoms.  She was continuing aspirin, atorvastatin and metoprolol.  Blood pressure was normal and there were no changes to therapy.  Patient is here for 22-month follow-up.  She denies any recent acute illnesses or hospitalizations.  Denies any anginal symptoms but does complain of recent DOE over the last 6 to 8 months.  States she has problems walking up inclines or walking up the steps in her apartment.  She denies any palpitations or arrhythmias, orthostatic symptoms, CVA or TIA-like symptoms, PND, orthopnea, claudication-like symptoms, DVT or PE-like symptoms, lower extremity edema.  No lightheadedness, dizziness, presyncope or syncopal episodes.  Had a negative stress test in December 2020   Past Medical History:  Diagnosis Date  . Acid reflux   . Hypercholesterolemia   . Hypertension     Past Surgical History:  Procedure Laterality Date  . ABDOMINAL HYSTERECTOMY  1979  . CARPAL TUNNEL RELEASE  03/2010, 03/2010   both wrists  . colonoscopy  08/31/2004  . ESOPHAGOGASTRODUODENOSCOPY  01/04/2001, 04/20/1999, 08/25/1997  . NISSEN FUNDOPLICATION  89/21/1941 & 10/21/1997  . TONSILLECTOMY    . TUBAL LIGATION  1972  . TYMPANOPLASTY  1964    Current Outpatient Medications  Medication Sig Dispense Refill  . aspirin 81 MG tablet Take 81 mg by  mouth daily.    Marland Kitchen atorvastatin (LIPITOR) 80 MG tablet Take 80 mg by mouth daily.    . calcium carbonate (CALCIUM 600) 600 MG TABS tablet Take 600 mg by mouth 2 (two) times daily with a meal.    . lisinopril (PRINIVIL,ZESTRIL) 2.5 MG tablet Take 2.5 mg by mouth daily.    . metoprolol (LOPRESSOR) 50 MG tablet Take 50 mg by mouth 2 (two) times daily.    . nitroGLYCERIN (NITROSTAT) 0.4 MG SL tablet Place 1 tablet (0.4 mg total) under the tongue every 5 (five) minutes x 3 doses as needed for chest pain (if no relief after 3rd dose, proceed to the ED for an evaluation or call 911). 25 tablet 3  . pantoprazole (PROTONIX) 20 MG tablet Take 20 mg by mouth daily.     No current facility-administered medications for this visit.   Allergies:  Lactose intolerance (gi) and Codeine   Social History: The patient  reports that she has never smoked. She has never used smokeless tobacco. She reports that she does not drink alcohol and does not use drugs.   Family History: The patient's family history includes Heart attack in her father and mother; Heart failure in her mother.   ROS:  Please see the history of present illness. Otherwise, complete review of systems is positive for none.  All other systems are reviewed and negative.   Physical Exam: VS:  BP 118/64   Pulse 63   Ht 5' 3.5" (1.613 m)   Wt 141 lb (64 kg)   SpO2 95%  BMI 24.59 kg/m , BMI Body mass index is 24.59 kg/m.  Wt Readings from Last 3 Encounters:  10/13/19 141 lb (64 kg)  07/02/19 142 lb (64.4 kg)  05/07/19 148 lb (67.1 kg)    General: Patient appears comfortable at rest. Neck: Supple, no elevated JVP or carotid bruits, no thyromegaly. Lungs: Clear to auscultation, nonlabored breathing at rest. Cardiac: Regular rate and rhythm, no S3 or significant systolic murmur, no pericardial rub. Extremities: No pitting edema, distal pulses 2+. Skin: Warm and dry. Musculoskeletal: No kyphosis. Neuropsychiatric: Alert and oriented x3,  affect grossly appropriate.  ECG:  An ECG dated 10/13/2019 was personally reviewed today and demonstrated:  Sinus bradycardia rate of 58.  Recent Labwork: No results found for requested labs within last 8760 hours.     Component Value Date/Time   CHOL 243 (H) 12/25/2007 1937   TRIG 247 (H) 12/25/2007 1937   HDL 44 12/25/2007 1937   CHOLHDL 5.5 Ratio 12/25/2007 1937   VLDL 49 (H) 12/25/2007 1937   LDLCALC 150 (H) 12/25/2007 1937    Other Studies Reviewed Today:   Nuclear stress test 01/03/2019:   There was no ST segment deviation noted during stress.  The study is normal. There are no perfusion defects  This is a low risk study.  The left ventricular ejection fraction is hyperdynamic (>65%).    Assessment and Plan:  1. CAD in native artery   2. Essential hypertension   3. Mixed hyperlipidemia   4. DOE (dyspnea on exertion)    1. CAD in native artery Denies any anginal symptoms or nitroglycerin use.  Continue aspirin 81 mg daily.  Continue nitroglycerin sublingual as needed for chest pain.  2. Essential hypertension Blood pressure is well controlled on current therapy.  Continue lisinopril 2.5 mg daily.  Continue metoprolol 50 mg p.o. twice daily.  3. Mixed hyperlipidemia Continue atorvastatin 80 mg p.o. daily.  Patient states she recently visited her PCP and all of her lab work was within normal limits.  4.  DOE Patient has complained recently of increasing dyspnea when walking up inclines or walking up her steps.  States the gradually increasing dyspnea has occurred over the last 6 to 8 months.  States it usually takes her about 5 minutes to recover after these types of activities.  Please get a repeat echocardiogram to assess LV function, diastolic function and valvular function.  Medication Adjustments/Labs and Tests Ordered: Current medicines are reviewed at length with the patient today.  Concerns regarding medicines are outlined above.   Disposition:  Follow-up with Dr. Domenic Polite or APP 6 months  Signed, Levell July, NP 10/13/2019 9:05 AM    [facility] [place], [place], [place] [address] Phone: [phone]; Fax: [phone]

## 2019-10-13 ENCOUNTER — Other Ambulatory Visit: Payer: Self-pay

## 2019-10-13 ENCOUNTER — Ambulatory Visit: Payer: Medicare HMO | Admitting: Family Medicine

## 2019-10-13 ENCOUNTER — Encounter: Payer: Self-pay | Admitting: Family Medicine

## 2019-10-13 VITALS — BP 118/64 | HR 63 | Ht 63.5 in | Wt 141.0 lb

## 2019-10-13 DIAGNOSIS — R0600 Dyspnea, unspecified: Secondary | ICD-10-CM

## 2019-10-13 DIAGNOSIS — R0609 Other forms of dyspnea: Secondary | ICD-10-CM

## 2019-10-13 DIAGNOSIS — I2510 Atherosclerotic heart disease of native coronary artery without angina pectoris: Secondary | ICD-10-CM

## 2019-10-13 DIAGNOSIS — E782 Mixed hyperlipidemia: Secondary | ICD-10-CM

## 2019-10-13 DIAGNOSIS — I10 Essential (primary) hypertension: Secondary | ICD-10-CM | POA: Diagnosis not present

## 2019-10-13 NOTE — Patient Instructions (Signed)

## 2019-10-19 DIAGNOSIS — N3001 Acute cystitis with hematuria: Secondary | ICD-10-CM | POA: Diagnosis not present

## 2019-10-19 DIAGNOSIS — R300 Dysuria: Secondary | ICD-10-CM | POA: Diagnosis not present

## 2019-10-25 DIAGNOSIS — R300 Dysuria: Secondary | ICD-10-CM | POA: Diagnosis not present

## 2019-10-29 DIAGNOSIS — M654 Radial styloid tenosynovitis [de Quervain]: Secondary | ICD-10-CM | POA: Diagnosis not present

## 2019-10-29 DIAGNOSIS — M19042 Primary osteoarthritis, left hand: Secondary | ICD-10-CM | POA: Diagnosis not present

## 2019-10-29 DIAGNOSIS — Z6824 Body mass index (BMI) 24.0-24.9, adult: Secondary | ICD-10-CM | POA: Diagnosis not present

## 2019-10-30 ENCOUNTER — Ambulatory Visit (INDEPENDENT_AMBULATORY_CARE_PROVIDER_SITE_OTHER): Payer: Medicare HMO

## 2019-10-30 DIAGNOSIS — R0600 Dyspnea, unspecified: Secondary | ICD-10-CM | POA: Diagnosis not present

## 2019-10-30 DIAGNOSIS — K219 Gastro-esophageal reflux disease without esophagitis: Secondary | ICD-10-CM | POA: Diagnosis not present

## 2019-10-30 DIAGNOSIS — R0609 Other forms of dyspnea: Secondary | ICD-10-CM

## 2019-10-30 DIAGNOSIS — I10 Essential (primary) hypertension: Secondary | ICD-10-CM | POA: Diagnosis not present

## 2019-10-30 DIAGNOSIS — E7849 Other hyperlipidemia: Secondary | ICD-10-CM | POA: Diagnosis not present

## 2019-10-30 LAB — ECHOCARDIOGRAM COMPLETE
Area-P 1/2: 3.42 cm2
Calc EF: 63.8 %
MV M vel: 4.17 m/s
MV Peak grad: 69.6 mmHg
S' Lateral: 3.04 cm
Single Plane A2C EF: 67.9 %
Single Plane A4C EF: 61.6 %

## 2019-11-04 ENCOUNTER — Telehealth: Payer: Self-pay | Admitting: *Deleted

## 2019-11-04 NOTE — Telephone Encounter (Signed)
Pt voiced understanding

## 2019-11-04 NOTE — Telephone Encounter (Signed)
-----   Message from Verta Ellen., NP sent at 10/31/2019  8:20 AM EDT ----- Echo shows good pumping function of her heart.  Mitral valve is just mildly leaking.  Everything looks good.  Thanks

## 2019-11-19 DIAGNOSIS — Z23 Encounter for immunization: Secondary | ICD-10-CM | POA: Diagnosis not present

## 2019-11-19 DIAGNOSIS — Z6823 Body mass index (BMI) 23.0-23.9, adult: Secondary | ICD-10-CM | POA: Diagnosis not present

## 2019-11-19 DIAGNOSIS — M19042 Primary osteoarthritis, left hand: Secondary | ICD-10-CM | POA: Diagnosis not present

## 2019-11-19 DIAGNOSIS — M654 Radial styloid tenosynovitis [de Quervain]: Secondary | ICD-10-CM | POA: Diagnosis not present

## 2019-11-29 DIAGNOSIS — I10 Essential (primary) hypertension: Secondary | ICD-10-CM | POA: Diagnosis not present

## 2019-11-29 DIAGNOSIS — E7849 Other hyperlipidemia: Secondary | ICD-10-CM | POA: Diagnosis not present

## 2019-11-29 DIAGNOSIS — K219 Gastro-esophageal reflux disease without esophagitis: Secondary | ICD-10-CM | POA: Diagnosis not present

## 2019-12-02 DIAGNOSIS — Z1329 Encounter for screening for other suspected endocrine disorder: Secondary | ICD-10-CM | POA: Diagnosis not present

## 2019-12-02 DIAGNOSIS — E782 Mixed hyperlipidemia: Secondary | ICD-10-CM | POA: Diagnosis not present

## 2019-12-02 DIAGNOSIS — I10 Essential (primary) hypertension: Secondary | ICD-10-CM | POA: Diagnosis not present

## 2019-12-02 DIAGNOSIS — I2510 Atherosclerotic heart disease of native coronary artery without angina pectoris: Secondary | ICD-10-CM | POA: Diagnosis not present

## 2019-12-02 DIAGNOSIS — K219 Gastro-esophageal reflux disease without esophagitis: Secondary | ICD-10-CM | POA: Diagnosis not present

## 2019-12-05 DIAGNOSIS — I2510 Atherosclerotic heart disease of native coronary artery without angina pectoris: Secondary | ICD-10-CM | POA: Diagnosis not present

## 2019-12-05 DIAGNOSIS — Z0001 Encounter for general adult medical examination with abnormal findings: Secondary | ICD-10-CM | POA: Diagnosis not present

## 2019-12-05 DIAGNOSIS — D126 Benign neoplasm of colon, unspecified: Secondary | ICD-10-CM | POA: Diagnosis not present

## 2019-12-05 DIAGNOSIS — R69 Illness, unspecified: Secondary | ICD-10-CM | POA: Diagnosis not present

## 2019-12-05 DIAGNOSIS — E782 Mixed hyperlipidemia: Secondary | ICD-10-CM | POA: Diagnosis not present

## 2019-12-05 DIAGNOSIS — Z955 Presence of coronary angioplasty implant and graft: Secondary | ICD-10-CM | POA: Diagnosis not present

## 2019-12-05 DIAGNOSIS — I10 Essential (primary) hypertension: Secondary | ICD-10-CM | POA: Diagnosis not present

## 2019-12-05 DIAGNOSIS — Z6823 Body mass index (BMI) 23.0-23.9, adult: Secondary | ICD-10-CM | POA: Diagnosis not present

## 2019-12-30 DIAGNOSIS — E7849 Other hyperlipidemia: Secondary | ICD-10-CM | POA: Diagnosis not present

## 2019-12-30 DIAGNOSIS — K219 Gastro-esophageal reflux disease without esophagitis: Secondary | ICD-10-CM | POA: Diagnosis not present

## 2019-12-30 DIAGNOSIS — I10 Essential (primary) hypertension: Secondary | ICD-10-CM | POA: Diagnosis not present

## 2019-12-31 DIAGNOSIS — R413 Other amnesia: Secondary | ICD-10-CM | POA: Diagnosis not present

## 2019-12-31 DIAGNOSIS — Z6823 Body mass index (BMI) 23.0-23.9, adult: Secondary | ICD-10-CM | POA: Diagnosis not present

## 2019-12-31 DIAGNOSIS — R69 Illness, unspecified: Secondary | ICD-10-CM | POA: Diagnosis not present

## 2020-01-27 DIAGNOSIS — R413 Other amnesia: Secondary | ICD-10-CM | POA: Diagnosis not present

## 2020-01-27 DIAGNOSIS — R69 Illness, unspecified: Secondary | ICD-10-CM | POA: Diagnosis not present

## 2020-01-27 DIAGNOSIS — Z6823 Body mass index (BMI) 23.0-23.9, adult: Secondary | ICD-10-CM | POA: Diagnosis not present

## 2020-01-30 DIAGNOSIS — E7849 Other hyperlipidemia: Secondary | ICD-10-CM | POA: Diagnosis not present

## 2020-01-30 DIAGNOSIS — K219 Gastro-esophageal reflux disease without esophagitis: Secondary | ICD-10-CM | POA: Diagnosis not present

## 2020-01-30 DIAGNOSIS — I10 Essential (primary) hypertension: Secondary | ICD-10-CM | POA: Diagnosis not present

## 2020-02-28 DIAGNOSIS — K219 Gastro-esophageal reflux disease without esophagitis: Secondary | ICD-10-CM | POA: Diagnosis not present

## 2020-02-28 DIAGNOSIS — I10 Essential (primary) hypertension: Secondary | ICD-10-CM | POA: Diagnosis not present

## 2020-02-28 DIAGNOSIS — E7849 Other hyperlipidemia: Secondary | ICD-10-CM | POA: Diagnosis not present

## 2020-03-29 DIAGNOSIS — K219 Gastro-esophageal reflux disease without esophagitis: Secondary | ICD-10-CM | POA: Diagnosis not present

## 2020-03-29 DIAGNOSIS — E7849 Other hyperlipidemia: Secondary | ICD-10-CM | POA: Diagnosis not present

## 2020-03-29 DIAGNOSIS — I10 Essential (primary) hypertension: Secondary | ICD-10-CM | POA: Diagnosis not present

## 2020-04-11 ENCOUNTER — Encounter: Payer: Self-pay | Admitting: Cardiology

## 2020-04-11 NOTE — Progress Notes (Deleted)
Cardiology Office Note  Date: 04/11/2020   ID: Tasha Peters, Tasha Peters [birth year], MRN [id number]  PCP:  Curlene Labrum, MD  Cardiologist:  Rozann Lesches, MD Electrophysiologist:  None   No chief complaint on file.   History of Present Illness: Tasha Peters is a 80 y.o. female former patient of Dr. Bronson Ing now presenting to establish follow-up with me.  I reviewed her records and updated the chart.  She was last seen in September 2021 by Mr. Leonides Sake NP.  Past Medical History:  Diagnosis Date  . Acid reflux   . CAD (coronary artery disease)    DES to prox LAD 78938; residual occluded mid circumflex, 50% RCA, 30-40% LM  . Essential hypertension   . Hyperlipidemia     Past Surgical History:  Procedure Laterality Date  . ABDOMINAL HYSTERECTOMY  1979  . CARPAL TUNNEL RELEASE Bilateral 03/2010, 03/2010  . COLONOSCOPY  08/31/2004  . ESOPHAGOGASTRODUODENOSCOPY  01/04/2001, 04/20/1999, 08/25/1997  . NISSEN FUNDOPLICATION  11/15/5100 & 10/21/1997  . TONSILLECTOMY    . TUBAL LIGATION  1972  . TYMPANOPLASTY  1964    Current Outpatient Medications  Medication Sig Dispense Refill  . aspirin 81 MG tablet Take 81 mg by mouth daily.    Marland Kitchen atorvastatin (LIPITOR) 80 MG tablet Take 80 mg by mouth daily.    . calcium carbonate (CALCIUM 600) 600 MG TABS tablet Take 600 mg by mouth 2 (two) times daily with a meal.    . lisinopril (PRINIVIL,ZESTRIL) 2.5 MG tablet Take 2.5 mg by mouth daily.    . metoprolol (LOPRESSOR) 50 MG tablet Take 50 mg by mouth 2 (two) times daily.    . nitroGLYCERIN (NITROSTAT) 0.4 MG SL tablet Place 1 tablet (0.4 mg total) under the tongue every 5 (five) minutes x 3 doses as needed for chest pain (if no relief after 3rd dose, proceed to the ED for an evaluation or call 911). 25 tablet 3  . pantoprazole (PROTONIX) 20 MG tablet Take 20 mg by mouth daily.     No current facility-administered medications for this visit.   Allergies:  Lactose intolerance (gi) and Codeine    Social History: The patient  reports that she has never smoked. She has never used smokeless tobacco. She reports that she does not drink alcohol and does not use drugs.   Family History: The patient's family history includes Heart attack in her father and mother; Heart failure in her mother.   ROS:  Please see the history of present illness. Otherwise, complete review of systems is positive for {NONE DEFAULTED:18576::"none"}.  All other systems are reviewed and negative.   Physical Exam: VS:  There were no vitals taken for this visit., BMI There is no height or weight on file to calculate BMI.  Wt Readings from Last 3 Encounters:  10/13/19 141 lb (64 kg)  07/02/19 142 lb (64.4 kg)  05/07/19 148 lb (67.1 kg)    General: Patient appears comfortable at rest. HEENT: Conjunctiva and lids normal, oropharynx clear with moist mucosa. Neck: Supple, no elevated JVP or carotid bruits, no thyromegaly. Lungs: Clear to auscultation, nonlabored breathing at rest. Cardiac: Regular rate and rhythm, no S3 or significant systolic murmur, no pericardial rub. Abdomen: Soft, nontender, no hepatomegaly, bowel sounds present, no guarding or rebound. Extremities: No pitting edema, distal pulses 2+. Skin: Warm and dry. Musculoskeletal: No kyphosis. Neuropsychiatric: Alert and oriented x3, affect grossly appropriate.  ECG:  An ECG dated 10/13/2019 was personally reviewed today and  demonstrated:  Sinus bradycardia.  Recent Labwork:  No recent lab work available for review.  Other Studies Reviewed Today:  Lexiscan Myoview 01/03/2019:  There was no ST segment deviation noted during stress.  The study is normal. There are no perfusion defects  This is a low risk study.  The left ventricular ejection fraction is hyperdynamic (>65%).  Echocardiogram 10/30/2019: 1. Left ventricular ejection fraction, by estimation, is 55 to 60%. The  left ventricle has normal function. The left ventricle has no  regional  wall motion abnormalities. Left ventricular diastolic parameters were  normal.  2. Right ventricular systolic function is normal. The right ventricular  size is normal. There is normal pulmonary artery systolic pressure. The  estimated right ventricular systolic pressure is 05.6 mmHg.  3. The mitral valve is grossly normal. Mild mitral valve regurgitation.  4. The aortic valve is tricuspid. Aortic valve regurgitation is not  visualized.  5. The inferior vena cava is normal in size with greater than 50%  respiratory variability, suggesting right atrial pressure of 3 mmHg.   Assessment and Plan:    Medication Adjustments/Labs and Tests Ordered: Current medicines are reviewed at length with the patient today.  Concerns regarding medicines are outlined above.   Tests Ordered: No orders of the defined types were placed in this encounter.   Medication Changes: No orders of the defined types were placed in this encounter.   Disposition:  Follow up {follow up:15908}  Signed, Satira Sark, MD, [facility] 04/11/2020 5:15 PM    [facility], [place], [place] [address] Phone: [phone]; Fax: [phone]

## 2020-04-13 ENCOUNTER — Ambulatory Visit: Payer: Medicare HMO | Admitting: Cardiology

## 2020-04-13 DIAGNOSIS — I25119 Atherosclerotic heart disease of native coronary artery with unspecified angina pectoris: Secondary | ICD-10-CM

## 2020-04-28 DIAGNOSIS — E7849 Other hyperlipidemia: Secondary | ICD-10-CM | POA: Diagnosis not present

## 2020-04-28 DIAGNOSIS — K219 Gastro-esophageal reflux disease without esophagitis: Secondary | ICD-10-CM | POA: Diagnosis not present

## 2020-04-28 DIAGNOSIS — I10 Essential (primary) hypertension: Secondary | ICD-10-CM | POA: Diagnosis not present

## 2020-05-13 DIAGNOSIS — Z20828 Contact with and (suspected) exposure to other viral communicable diseases: Secondary | ICD-10-CM | POA: Diagnosis not present

## 2020-05-27 DIAGNOSIS — Z23 Encounter for immunization: Secondary | ICD-10-CM | POA: Diagnosis not present

## 2020-05-29 DIAGNOSIS — K219 Gastro-esophageal reflux disease without esophagitis: Secondary | ICD-10-CM | POA: Diagnosis not present

## 2020-05-29 DIAGNOSIS — I10 Essential (primary) hypertension: Secondary | ICD-10-CM | POA: Diagnosis not present

## 2020-05-29 DIAGNOSIS — E7849 Other hyperlipidemia: Secondary | ICD-10-CM | POA: Diagnosis not present

## 2020-05-31 DIAGNOSIS — E7849 Other hyperlipidemia: Secondary | ICD-10-CM | POA: Diagnosis not present

## 2020-05-31 DIAGNOSIS — E559 Vitamin D deficiency, unspecified: Secondary | ICD-10-CM | POA: Diagnosis not present

## 2020-05-31 DIAGNOSIS — E782 Mixed hyperlipidemia: Secondary | ICD-10-CM | POA: Diagnosis not present

## 2020-05-31 DIAGNOSIS — I10 Essential (primary) hypertension: Secondary | ICD-10-CM | POA: Diagnosis not present

## 2020-06-15 DIAGNOSIS — I10 Essential (primary) hypertension: Secondary | ICD-10-CM | POA: Diagnosis not present

## 2020-06-15 DIAGNOSIS — Z6823 Body mass index (BMI) 23.0-23.9, adult: Secondary | ICD-10-CM | POA: Diagnosis not present

## 2020-06-15 DIAGNOSIS — I2510 Atherosclerotic heart disease of native coronary artery without angina pectoris: Secondary | ICD-10-CM | POA: Diagnosis not present

## 2020-06-15 DIAGNOSIS — E7849 Other hyperlipidemia: Secondary | ICD-10-CM | POA: Diagnosis not present

## 2020-06-15 DIAGNOSIS — M5136 Other intervertebral disc degeneration, lumbar region: Secondary | ICD-10-CM | POA: Diagnosis not present

## 2020-06-15 DIAGNOSIS — K219 Gastro-esophageal reflux disease without esophagitis: Secondary | ICD-10-CM | POA: Diagnosis not present

## 2020-06-15 DIAGNOSIS — R69 Illness, unspecified: Secondary | ICD-10-CM | POA: Diagnosis not present

## 2020-06-28 DIAGNOSIS — K219 Gastro-esophageal reflux disease without esophagitis: Secondary | ICD-10-CM | POA: Diagnosis not present

## 2020-06-28 DIAGNOSIS — I10 Essential (primary) hypertension: Secondary | ICD-10-CM | POA: Diagnosis not present

## 2020-06-28 DIAGNOSIS — E7849 Other hyperlipidemia: Secondary | ICD-10-CM | POA: Diagnosis not present

## 2020-07-29 DIAGNOSIS — I10 Essential (primary) hypertension: Secondary | ICD-10-CM | POA: Diagnosis not present

## 2020-07-29 DIAGNOSIS — K219 Gastro-esophageal reflux disease without esophagitis: Secondary | ICD-10-CM | POA: Diagnosis not present

## 2020-07-29 DIAGNOSIS — E7849 Other hyperlipidemia: Secondary | ICD-10-CM | POA: Diagnosis not present

## 2020-08-20 DIAGNOSIS — M19041 Primary osteoarthritis, right hand: Secondary | ICD-10-CM | POA: Diagnosis not present

## 2020-08-20 DIAGNOSIS — M24541 Contracture, right hand: Secondary | ICD-10-CM | POA: Diagnosis not present

## 2020-08-20 DIAGNOSIS — M62541 Muscle wasting and atrophy, not elsewhere classified, right hand: Secondary | ICD-10-CM | POA: Diagnosis not present

## 2020-08-20 DIAGNOSIS — Z6823 Body mass index (BMI) 23.0-23.9, adult: Secondary | ICD-10-CM | POA: Diagnosis not present

## 2020-08-29 DIAGNOSIS — I10 Essential (primary) hypertension: Secondary | ICD-10-CM | POA: Diagnosis not present

## 2020-08-29 DIAGNOSIS — K219 Gastro-esophageal reflux disease without esophagitis: Secondary | ICD-10-CM | POA: Diagnosis not present

## 2020-08-29 DIAGNOSIS — E7849 Other hyperlipidemia: Secondary | ICD-10-CM | POA: Diagnosis not present

## 2020-08-31 DIAGNOSIS — I2510 Atherosclerotic heart disease of native coronary artery without angina pectoris: Secondary | ICD-10-CM | POA: Diagnosis not present

## 2020-08-31 DIAGNOSIS — I10 Essential (primary) hypertension: Secondary | ICD-10-CM | POA: Diagnosis not present

## 2020-09-15 DIAGNOSIS — D519 Vitamin B12 deficiency anemia, unspecified: Secondary | ICD-10-CM | POA: Diagnosis not present

## 2020-09-15 DIAGNOSIS — D649 Anemia, unspecified: Secondary | ICD-10-CM | POA: Diagnosis not present

## 2020-09-15 DIAGNOSIS — E7849 Other hyperlipidemia: Secondary | ICD-10-CM | POA: Diagnosis not present

## 2020-09-15 DIAGNOSIS — D529 Folate deficiency anemia, unspecified: Secondary | ICD-10-CM | POA: Diagnosis not present

## 2020-09-15 DIAGNOSIS — E782 Mixed hyperlipidemia: Secondary | ICD-10-CM | POA: Diagnosis not present

## 2020-09-15 DIAGNOSIS — I10 Essential (primary) hypertension: Secondary | ICD-10-CM | POA: Diagnosis not present

## 2020-09-20 DIAGNOSIS — E7849 Other hyperlipidemia: Secondary | ICD-10-CM | POA: Diagnosis not present

## 2020-09-20 DIAGNOSIS — I2510 Atherosclerotic heart disease of native coronary artery without angina pectoris: Secondary | ICD-10-CM | POA: Diagnosis not present

## 2020-09-20 DIAGNOSIS — Z1389 Encounter for screening for other disorder: Secondary | ICD-10-CM | POA: Diagnosis not present

## 2020-09-20 DIAGNOSIS — Z6823 Body mass index (BMI) 23.0-23.9, adult: Secondary | ICD-10-CM | POA: Diagnosis not present

## 2020-09-20 DIAGNOSIS — I10 Essential (primary) hypertension: Secondary | ICD-10-CM | POA: Diagnosis not present

## 2020-09-20 DIAGNOSIS — Z955 Presence of coronary angioplasty implant and graft: Secondary | ICD-10-CM | POA: Diagnosis not present

## 2020-09-29 DIAGNOSIS — K219 Gastro-esophageal reflux disease without esophagitis: Secondary | ICD-10-CM | POA: Diagnosis not present

## 2020-09-29 DIAGNOSIS — E7849 Other hyperlipidemia: Secondary | ICD-10-CM | POA: Diagnosis not present

## 2020-09-29 DIAGNOSIS — I10 Essential (primary) hypertension: Secondary | ICD-10-CM | POA: Diagnosis not present

## 2020-10-29 DIAGNOSIS — I10 Essential (primary) hypertension: Secondary | ICD-10-CM | POA: Diagnosis not present

## 2020-10-29 DIAGNOSIS — E7849 Other hyperlipidemia: Secondary | ICD-10-CM | POA: Diagnosis not present

## 2020-10-29 DIAGNOSIS — K219 Gastro-esophageal reflux disease without esophagitis: Secondary | ICD-10-CM | POA: Diagnosis not present

## 2020-11-29 DIAGNOSIS — K219 Gastro-esophageal reflux disease without esophagitis: Secondary | ICD-10-CM | POA: Diagnosis not present

## 2020-11-29 DIAGNOSIS — E7849 Other hyperlipidemia: Secondary | ICD-10-CM | POA: Diagnosis not present

## 2020-11-29 DIAGNOSIS — I10 Essential (primary) hypertension: Secondary | ICD-10-CM | POA: Diagnosis not present

## 2020-12-29 DIAGNOSIS — K219 Gastro-esophageal reflux disease without esophagitis: Secondary | ICD-10-CM | POA: Diagnosis not present

## 2020-12-29 DIAGNOSIS — I10 Essential (primary) hypertension: Secondary | ICD-10-CM | POA: Diagnosis not present

## 2020-12-29 DIAGNOSIS — E7849 Other hyperlipidemia: Secondary | ICD-10-CM | POA: Diagnosis not present

## 2021-01-28 DIAGNOSIS — E7849 Other hyperlipidemia: Secondary | ICD-10-CM | POA: Diagnosis not present

## 2021-01-28 DIAGNOSIS — I10 Essential (primary) hypertension: Secondary | ICD-10-CM | POA: Diagnosis not present

## 2021-01-28 DIAGNOSIS — K219 Gastro-esophageal reflux disease without esophagitis: Secondary | ICD-10-CM | POA: Diagnosis not present

## 2021-02-03 DIAGNOSIS — Z6823 Body mass index (BMI) 23.0-23.9, adult: Secondary | ICD-10-CM | POA: Diagnosis not present

## 2021-02-03 DIAGNOSIS — R413 Other amnesia: Secondary | ICD-10-CM | POA: Diagnosis not present

## 2021-02-07 DIAGNOSIS — J320 Chronic maxillary sinusitis: Secondary | ICD-10-CM | POA: Diagnosis not present

## 2021-02-07 DIAGNOSIS — I6782 Cerebral ischemia: Secondary | ICD-10-CM | POA: Diagnosis not present

## 2021-02-07 DIAGNOSIS — G9389 Other specified disorders of brain: Secondary | ICD-10-CM | POA: Diagnosis not present

## 2021-02-16 DIAGNOSIS — K219 Gastro-esophageal reflux disease without esophagitis: Secondary | ICD-10-CM | POA: Diagnosis not present

## 2021-02-16 DIAGNOSIS — E782 Mixed hyperlipidemia: Secondary | ICD-10-CM | POA: Diagnosis not present

## 2021-02-16 DIAGNOSIS — E7849 Other hyperlipidemia: Secondary | ICD-10-CM | POA: Diagnosis not present

## 2021-02-16 DIAGNOSIS — Z1329 Encounter for screening for other suspected endocrine disorder: Secondary | ICD-10-CM | POA: Diagnosis not present

## 2021-02-16 DIAGNOSIS — I10 Essential (primary) hypertension: Secondary | ICD-10-CM | POA: Diagnosis not present

## 2021-02-22 DIAGNOSIS — I10 Essential (primary) hypertension: Secondary | ICD-10-CM | POA: Diagnosis not present

## 2021-02-22 DIAGNOSIS — Z955 Presence of coronary angioplasty implant and graft: Secondary | ICD-10-CM | POA: Diagnosis not present

## 2021-02-22 DIAGNOSIS — E7849 Other hyperlipidemia: Secondary | ICD-10-CM | POA: Diagnosis not present

## 2021-02-22 DIAGNOSIS — R413 Other amnesia: Secondary | ICD-10-CM | POA: Diagnosis not present

## 2021-02-22 DIAGNOSIS — I2510 Atherosclerotic heart disease of native coronary artery without angina pectoris: Secondary | ICD-10-CM | POA: Diagnosis not present

## 2021-02-22 DIAGNOSIS — Z0001 Encounter for general adult medical examination with abnormal findings: Secondary | ICD-10-CM | POA: Diagnosis not present

## 2021-02-27 DIAGNOSIS — K219 Gastro-esophageal reflux disease without esophagitis: Secondary | ICD-10-CM | POA: Diagnosis not present

## 2021-02-27 DIAGNOSIS — E7849 Other hyperlipidemia: Secondary | ICD-10-CM | POA: Diagnosis not present

## 2021-02-27 DIAGNOSIS — I10 Essential (primary) hypertension: Secondary | ICD-10-CM | POA: Diagnosis not present

## 2021-04-05 DIAGNOSIS — I2510 Atherosclerotic heart disease of native coronary artery without angina pectoris: Secondary | ICD-10-CM | POA: Diagnosis not present

## 2021-04-05 DIAGNOSIS — I10 Essential (primary) hypertension: Secondary | ICD-10-CM | POA: Diagnosis not present

## 2021-04-05 DIAGNOSIS — E7849 Other hyperlipidemia: Secondary | ICD-10-CM | POA: Diagnosis not present

## 2021-04-05 DIAGNOSIS — Z955 Presence of coronary angioplasty implant and graft: Secondary | ICD-10-CM | POA: Diagnosis not present

## 2021-04-05 DIAGNOSIS — R413 Other amnesia: Secondary | ICD-10-CM | POA: Diagnosis not present

## 2021-04-05 DIAGNOSIS — Z6822 Body mass index (BMI) 22.0-22.9, adult: Secondary | ICD-10-CM | POA: Diagnosis not present

## 2021-04-21 DIAGNOSIS — E7849 Other hyperlipidemia: Secondary | ICD-10-CM | POA: Diagnosis not present

## 2021-04-21 DIAGNOSIS — R413 Other amnesia: Secondary | ICD-10-CM | POA: Diagnosis not present

## 2021-04-21 DIAGNOSIS — I2510 Atherosclerotic heart disease of native coronary artery without angina pectoris: Secondary | ICD-10-CM | POA: Diagnosis not present

## 2021-04-21 DIAGNOSIS — F03918 Unspecified dementia, unspecified severity, with other behavioral disturbance: Secondary | ICD-10-CM | POA: Diagnosis not present

## 2021-04-21 DIAGNOSIS — Z955 Presence of coronary angioplasty implant and graft: Secondary | ICD-10-CM | POA: Diagnosis not present

## 2021-04-21 DIAGNOSIS — I10 Essential (primary) hypertension: Secondary | ICD-10-CM | POA: Diagnosis not present

## 2021-04-21 DIAGNOSIS — E782 Mixed hyperlipidemia: Secondary | ICD-10-CM | POA: Diagnosis not present

## 2021-04-29 DIAGNOSIS — K219 Gastro-esophageal reflux disease without esophagitis: Secondary | ICD-10-CM | POA: Diagnosis not present

## 2021-04-29 DIAGNOSIS — E7849 Other hyperlipidemia: Secondary | ICD-10-CM | POA: Diagnosis not present

## 2021-04-29 DIAGNOSIS — I10 Essential (primary) hypertension: Secondary | ICD-10-CM | POA: Diagnosis not present

## 2021-05-11 DIAGNOSIS — I10 Essential (primary) hypertension: Secondary | ICD-10-CM | POA: Diagnosis not present

## 2021-05-11 DIAGNOSIS — N390 Urinary tract infection, site not specified: Secondary | ICD-10-CM | POA: Diagnosis not present

## 2021-05-16 DIAGNOSIS — R300 Dysuria: Secondary | ICD-10-CM | POA: Diagnosis not present

## 2021-05-26 DIAGNOSIS — I10 Essential (primary) hypertension: Secondary | ICD-10-CM | POA: Diagnosis not present

## 2021-05-26 DIAGNOSIS — Z955 Presence of coronary angioplasty implant and graft: Secondary | ICD-10-CM | POA: Diagnosis not present

## 2021-05-26 DIAGNOSIS — B9620 Unspecified Escherichia coli [E. coli] as the cause of diseases classified elsewhere: Secondary | ICD-10-CM | POA: Diagnosis not present

## 2021-05-26 DIAGNOSIS — R413 Other amnesia: Secondary | ICD-10-CM | POA: Diagnosis not present

## 2021-05-26 DIAGNOSIS — N390 Urinary tract infection, site not specified: Secondary | ICD-10-CM | POA: Diagnosis not present

## 2021-05-26 DIAGNOSIS — F0390 Unspecified dementia without behavioral disturbance: Secondary | ICD-10-CM | POA: Diagnosis not present

## 2021-05-26 DIAGNOSIS — I2510 Atherosclerotic heart disease of native coronary artery without angina pectoris: Secondary | ICD-10-CM | POA: Diagnosis not present

## 2021-05-26 DIAGNOSIS — E7849 Other hyperlipidemia: Secondary | ICD-10-CM | POA: Diagnosis not present

## 2021-05-29 DIAGNOSIS — K219 Gastro-esophageal reflux disease without esophagitis: Secondary | ICD-10-CM | POA: Diagnosis not present

## 2021-05-29 DIAGNOSIS — I10 Essential (primary) hypertension: Secondary | ICD-10-CM | POA: Diagnosis not present

## 2021-05-29 DIAGNOSIS — E7849 Other hyperlipidemia: Secondary | ICD-10-CM | POA: Diagnosis not present

## 2021-06-29 DIAGNOSIS — I10 Essential (primary) hypertension: Secondary | ICD-10-CM | POA: Diagnosis not present

## 2021-06-29 DIAGNOSIS — E7849 Other hyperlipidemia: Secondary | ICD-10-CM | POA: Diagnosis not present

## 2021-06-29 DIAGNOSIS — K219 Gastro-esophageal reflux disease without esophagitis: Secondary | ICD-10-CM | POA: Diagnosis not present

## 2021-07-29 DIAGNOSIS — E7849 Other hyperlipidemia: Secondary | ICD-10-CM | POA: Diagnosis not present

## 2021-07-29 DIAGNOSIS — K219 Gastro-esophageal reflux disease without esophagitis: Secondary | ICD-10-CM | POA: Diagnosis not present

## 2021-09-29 DIAGNOSIS — K219 Gastro-esophageal reflux disease without esophagitis: Secondary | ICD-10-CM | POA: Diagnosis not present

## 2021-09-29 DIAGNOSIS — I10 Essential (primary) hypertension: Secondary | ICD-10-CM | POA: Diagnosis not present

## 2021-09-29 DIAGNOSIS — E782 Mixed hyperlipidemia: Secondary | ICD-10-CM | POA: Diagnosis not present

## 2021-10-04 DIAGNOSIS — J069 Acute upper respiratory infection, unspecified: Secondary | ICD-10-CM | POA: Diagnosis not present

## 2021-10-04 DIAGNOSIS — Z20828 Contact with and (suspected) exposure to other viral communicable diseases: Secondary | ICD-10-CM | POA: Diagnosis not present

## 2021-10-04 DIAGNOSIS — Z6822 Body mass index (BMI) 22.0-22.9, adult: Secondary | ICD-10-CM | POA: Diagnosis not present

## 2021-10-04 DIAGNOSIS — R030 Elevated blood-pressure reading, without diagnosis of hypertension: Secondary | ICD-10-CM | POA: Diagnosis not present

## 2021-10-04 DIAGNOSIS — R059 Cough, unspecified: Secondary | ICD-10-CM | POA: Diagnosis not present

## 2021-11-10 IMAGING — MR MR LUMBAR SPINE W/O CM
4 of 5 series · 30 of 48 positions shown · non-contrast
Comparison: X-ray 04/15/2019

CLINICAL DATA: Low back pain

EXAM:
MRI LUMBAR SPINE WITHOUT CONTRAST
TECHNIQUE: Multiplanar, multisequence MR imaging of the lumbar spine was
performed. No intravenous contrast was administered.

[Series 5: T1 · sagittal · 4.0mm · 0.81mm/px · 5 of 14 slices shown (1 of 2)]
[im 1/14]
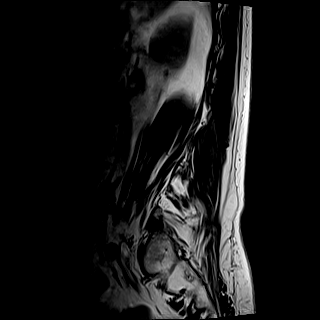
[im 4/14]
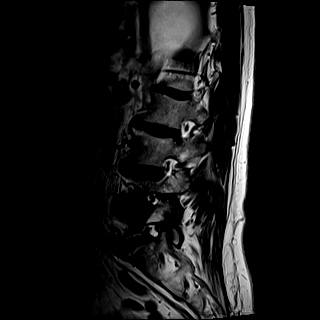
[im 7/14]
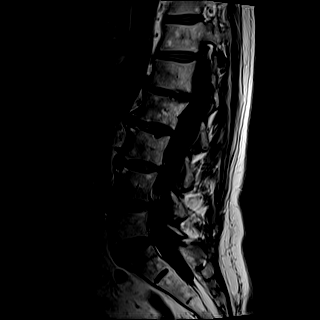
[im 10/14]
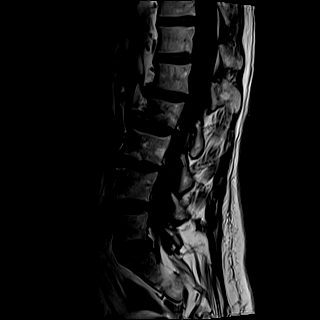
[im 14/14]
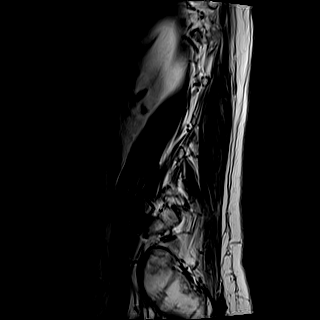

[Series 6: T2 · sagittal · 4.0mm · 0.81mm/px · 5 of 14 slices shown (1 of 2)]
[im 1/14]
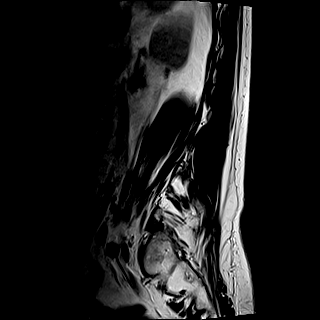
[im 4/14]
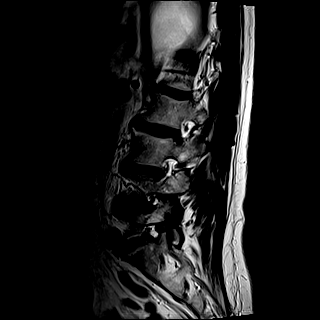
[im 7/14]
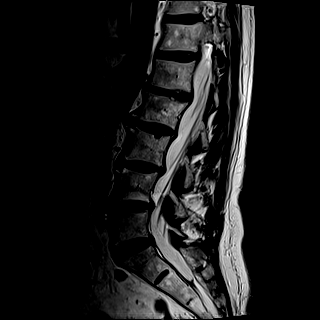
[im 10/14]
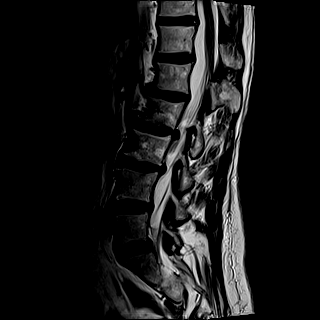
[im 14/14]
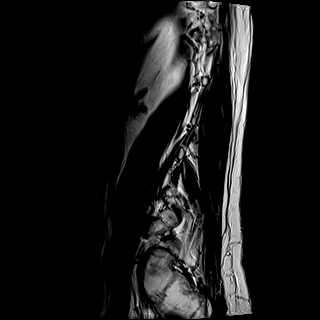

[Series 8: T2 · axial · 4.0mm · 0.62mm/px · z∈[+27,+226]mm · 10 of 40 slices shown (2 of 2)]
[im 3/40]
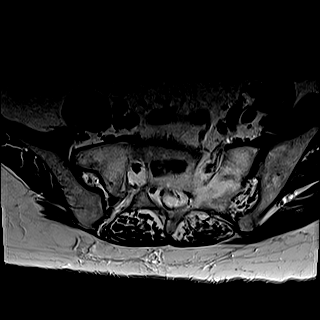
[im 6/40]
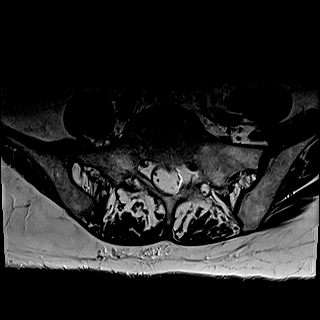
[im 8/40]
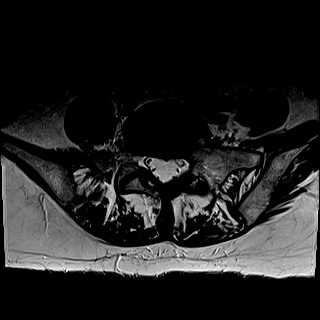
[im 14/40]
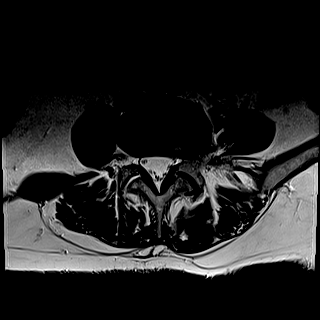
[im 19/40]
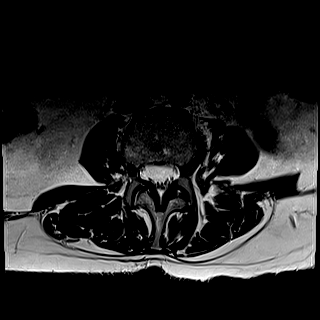
[im 21/40]
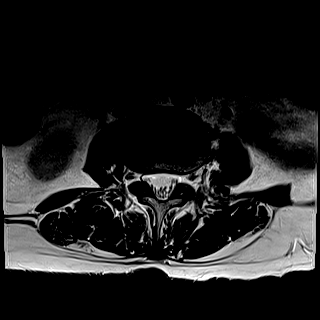
[im 24/40]
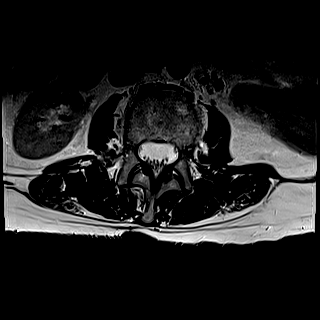
[im 29/40]
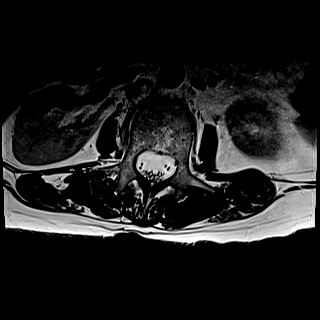
[im 34/40]
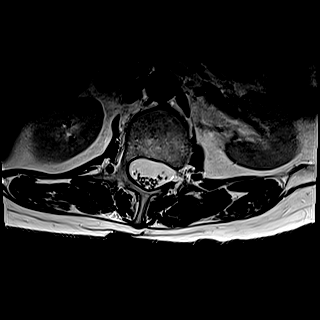
[im 40/40]
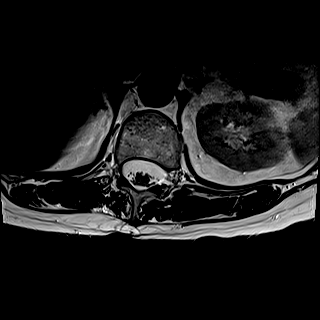

[Series 9: T1 · axial · 4.0mm · 0.43mm/px · z∈[+28,+228]mm · 10 of 40 slices shown (2 of 2)]
[im 3/40]
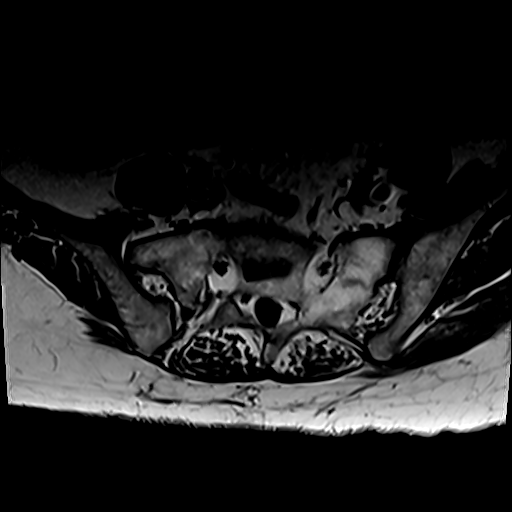
[im 6/40]
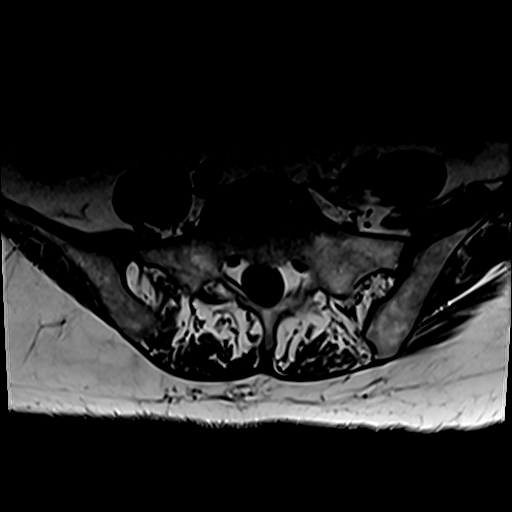
[im 8/40]
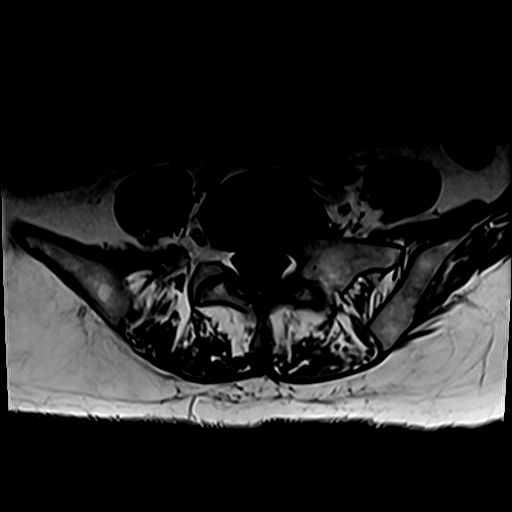
[im 14/40]
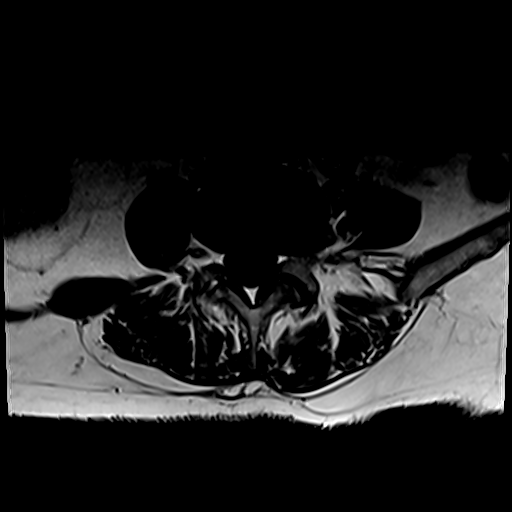
[im 19/40]
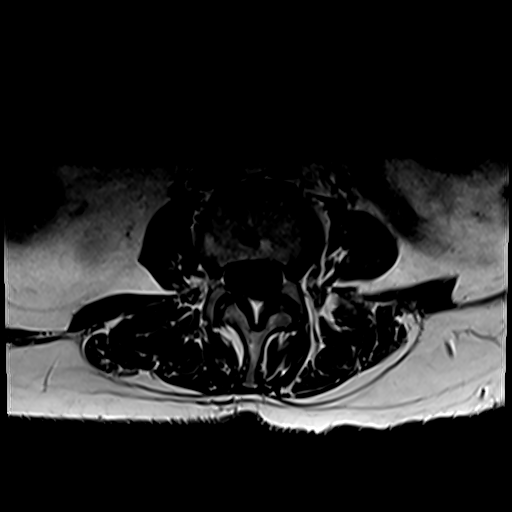
[im 21/40]
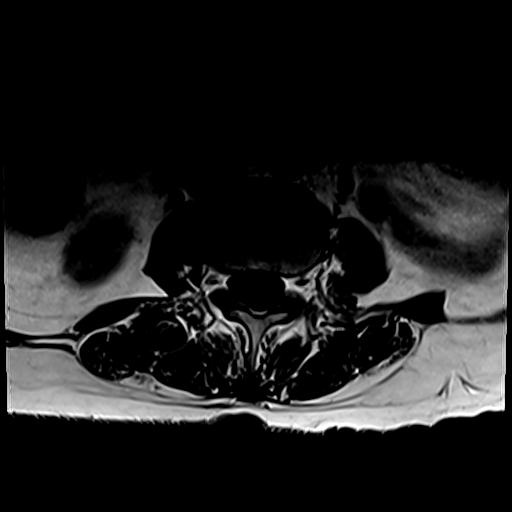
[im 24/40]
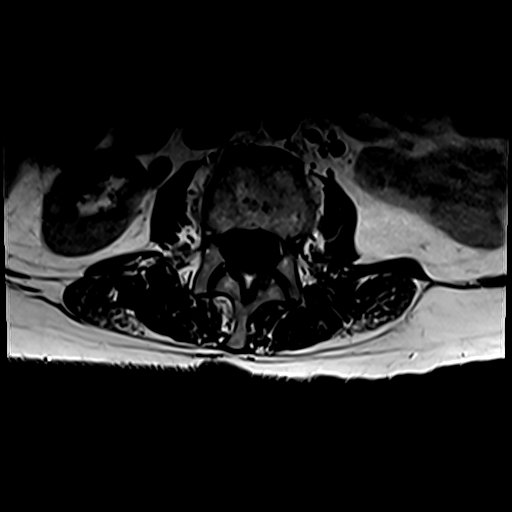
[im 29/40]
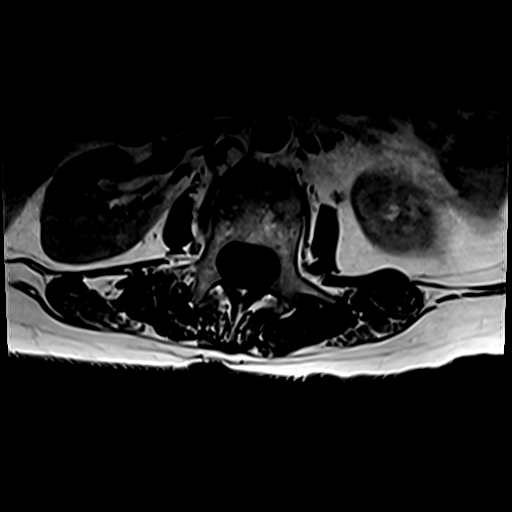
[im 34/40]
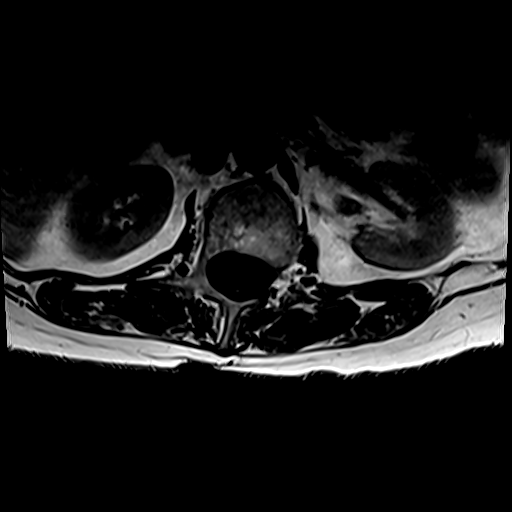
[im 40/40]
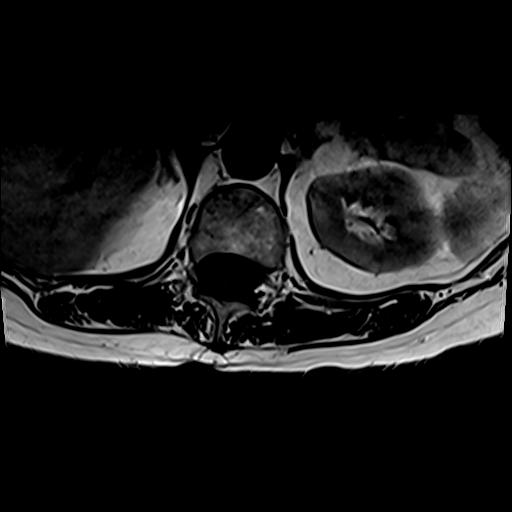

[30 of 48 positions shown; findings below may reference images not displayed]

FINDINGS: Segmentation:  Standard.

Alignment:  Slight levocurvature. No static listhesis.

Vertebrae: No fracture, evidence of discitis, or suspicious bone
lesion. Multilevel discogenic endplate marrow changes, most
pronounced at L4-5. Incidental intraosseous hemangioma noted within
the L3 vertebral body, unchanged.

Conus medullaris and cauda equina: Conus extends to the L1 level.
Conus and cauda equina appear normal.

Paraspinal and other soft tissues: Negative.

Disc levels:

T12-L1: No significant disc protrusion, foraminal stenosis, or canal
stenosis.

L1-L2: Minimal disc bulge. Posterior annular fissure. No foraminal
or canal stenosis.

L2-L3: Disc desiccation and height loss with small right greater
than left biforaminal protrusions, progressed from prior. No
foraminal or canal stenosis.

L3-L4: Mild diffuse disc bulge resulting in mild bilateral
subarticular recess narrowing without evidence of the impingement.
No foraminal or canal stenosis. Findings slightly progressed from
prior.

L4-L5: Minimal diffuse disc bulge and mild left greater than right
facet arthropathy resulting in mild-to-moderate left foraminal
stenosis. No canal stenosis. Findings progressed from prior.

L5-S1: Minimal disc bulge and mild bilateral facet arthropathy. No
foraminal or canal stenosis.
IMPRESSION: 1. Mild lumbar spondylosis, as described above. Findings slightly
progressed at the L2-L3 through L4-L5 levels.
2. Mild-to-moderate left foraminal stenosis at L4-L5.

## 2021-11-15 DIAGNOSIS — Z23 Encounter for immunization: Secondary | ICD-10-CM | POA: Diagnosis not present

## 2021-12-29 DIAGNOSIS — K219 Gastro-esophageal reflux disease without esophagitis: Secondary | ICD-10-CM | POA: Diagnosis not present

## 2021-12-29 DIAGNOSIS — E782 Mixed hyperlipidemia: Secondary | ICD-10-CM | POA: Diagnosis not present

## 2021-12-29 DIAGNOSIS — I10 Essential (primary) hypertension: Secondary | ICD-10-CM | POA: Diagnosis not present

## 2021-12-30 DIAGNOSIS — Z6822 Body mass index (BMI) 22.0-22.9, adult: Secondary | ICD-10-CM | POA: Diagnosis not present

## 2021-12-30 DIAGNOSIS — M1711 Unilateral primary osteoarthritis, right knee: Secondary | ICD-10-CM | POA: Diagnosis not present

## 2021-12-30 DIAGNOSIS — S8001XA Contusion of right knee, initial encounter: Secondary | ICD-10-CM | POA: Diagnosis not present

## 2022-01-02 DIAGNOSIS — Z23 Encounter for immunization: Secondary | ICD-10-CM | POA: Diagnosis not present

## 2022-01-19 DIAGNOSIS — I10 Essential (primary) hypertension: Secondary | ICD-10-CM | POA: Diagnosis not present

## 2022-01-19 DIAGNOSIS — E7849 Other hyperlipidemia: Secondary | ICD-10-CM | POA: Diagnosis not present

## 2022-01-19 DIAGNOSIS — K219 Gastro-esophageal reflux disease without esophagitis: Secondary | ICD-10-CM | POA: Diagnosis not present

## 2022-01-19 DIAGNOSIS — E559 Vitamin D deficiency, unspecified: Secondary | ICD-10-CM | POA: Diagnosis not present

## 2022-01-19 DIAGNOSIS — D519 Vitamin B12 deficiency anemia, unspecified: Secondary | ICD-10-CM | POA: Diagnosis not present

## 2022-01-31 DIAGNOSIS — F0390 Unspecified dementia without behavioral disturbance: Secondary | ICD-10-CM | POA: Diagnosis not present

## 2022-01-31 DIAGNOSIS — E7849 Other hyperlipidemia: Secondary | ICD-10-CM | POA: Diagnosis not present

## 2022-01-31 DIAGNOSIS — Z955 Presence of coronary angioplasty implant and graft: Secondary | ICD-10-CM | POA: Diagnosis not present

## 2022-01-31 DIAGNOSIS — L989 Disorder of the skin and subcutaneous tissue, unspecified: Secondary | ICD-10-CM | POA: Diagnosis not present

## 2022-01-31 DIAGNOSIS — I10 Essential (primary) hypertension: Secondary | ICD-10-CM | POA: Diagnosis not present

## 2022-01-31 DIAGNOSIS — R413 Other amnesia: Secondary | ICD-10-CM | POA: Diagnosis not present

## 2022-01-31 DIAGNOSIS — Z6821 Body mass index (BMI) 21.0-21.9, adult: Secondary | ICD-10-CM | POA: Diagnosis not present

## 2022-01-31 DIAGNOSIS — I2510 Atherosclerotic heart disease of native coronary artery without angina pectoris: Secondary | ICD-10-CM | POA: Diagnosis not present

## 2022-02-10 DIAGNOSIS — R030 Elevated blood-pressure reading, without diagnosis of hypertension: Secondary | ICD-10-CM | POA: Diagnosis not present

## 2022-02-10 DIAGNOSIS — Z6822 Body mass index (BMI) 22.0-22.9, adult: Secondary | ICD-10-CM | POA: Diagnosis not present

## 2022-02-10 DIAGNOSIS — L03311 Cellulitis of abdominal wall: Secondary | ICD-10-CM | POA: Diagnosis not present

## 2022-04-04 DIAGNOSIS — M654 Radial styloid tenosynovitis [de Quervain]: Secondary | ICD-10-CM | POA: Diagnosis not present

## 2022-04-04 DIAGNOSIS — M19041 Primary osteoarthritis, right hand: Secondary | ICD-10-CM | POA: Diagnosis not present

## 2022-04-04 DIAGNOSIS — Z6820 Body mass index (BMI) 20.0-20.9, adult: Secondary | ICD-10-CM | POA: Diagnosis not present

## 2022-04-04 DIAGNOSIS — M62541 Muscle wasting and atrophy, not elsewhere classified, right hand: Secondary | ICD-10-CM | POA: Diagnosis not present

## 2022-04-04 DIAGNOSIS — R030 Elevated blood-pressure reading, without diagnosis of hypertension: Secondary | ICD-10-CM | POA: Diagnosis not present

## 2022-04-20 DIAGNOSIS — Z6821 Body mass index (BMI) 21.0-21.9, adult: Secondary | ICD-10-CM | POA: Diagnosis not present

## 2022-04-20 DIAGNOSIS — I2510 Atherosclerotic heart disease of native coronary artery without angina pectoris: Secondary | ICD-10-CM | POA: Diagnosis not present

## 2022-04-20 DIAGNOSIS — F03918 Unspecified dementia, unspecified severity, with other behavioral disturbance: Secondary | ICD-10-CM | POA: Diagnosis not present

## 2022-04-20 DIAGNOSIS — Z955 Presence of coronary angioplasty implant and graft: Secondary | ICD-10-CM | POA: Diagnosis not present

## 2022-04-20 DIAGNOSIS — E7849 Other hyperlipidemia: Secondary | ICD-10-CM | POA: Diagnosis not present

## 2022-04-20 DIAGNOSIS — Z9183 Wandering in diseases classified elsewhere: Secondary | ICD-10-CM | POA: Diagnosis not present

## 2022-04-20 DIAGNOSIS — R030 Elevated blood-pressure reading, without diagnosis of hypertension: Secondary | ICD-10-CM | POA: Diagnosis not present

## 2022-08-10 DIAGNOSIS — Z6821 Body mass index (BMI) 21.0-21.9, adult: Secondary | ICD-10-CM | POA: Diagnosis not present

## 2022-08-10 DIAGNOSIS — L989 Disorder of the skin and subcutaneous tissue, unspecified: Secondary | ICD-10-CM | POA: Diagnosis not present

## 2022-08-14 DIAGNOSIS — Z0000 Encounter for general adult medical examination without abnormal findings: Secondary | ICD-10-CM | POA: Diagnosis not present

## 2022-08-28 DIAGNOSIS — Z6822 Body mass index (BMI) 22.0-22.9, adult: Secondary | ICD-10-CM | POA: Diagnosis not present

## 2022-08-28 DIAGNOSIS — L03111 Cellulitis of right axilla: Secondary | ICD-10-CM | POA: Diagnosis not present

## 2022-09-14 DIAGNOSIS — C44311 Basal cell carcinoma of skin of nose: Secondary | ICD-10-CM | POA: Diagnosis not present

## 2022-09-28 DIAGNOSIS — Z6822 Body mass index (BMI) 22.0-22.9, adult: Secondary | ICD-10-CM | POA: Diagnosis not present

## 2022-09-28 DIAGNOSIS — L0292 Furuncle, unspecified: Secondary | ICD-10-CM | POA: Diagnosis not present

## 2022-11-09 DIAGNOSIS — C44311 Basal cell carcinoma of skin of nose: Secondary | ICD-10-CM | POA: Diagnosis not present

## 2022-12-05 DIAGNOSIS — C44311 Basal cell carcinoma of skin of nose: Secondary | ICD-10-CM | POA: Diagnosis not present

## 2023-05-02 DIAGNOSIS — I2510 Atherosclerotic heart disease of native coronary artery without angina pectoris: Secondary | ICD-10-CM | POA: Diagnosis not present

## 2023-05-02 DIAGNOSIS — Z6821 Body mass index (BMI) 21.0-21.9, adult: Secondary | ICD-10-CM | POA: Diagnosis not present

## 2023-05-02 DIAGNOSIS — Z1321 Encounter for screening for nutritional disorder: Secondary | ICD-10-CM | POA: Diagnosis not present

## 2023-05-02 DIAGNOSIS — E782 Mixed hyperlipidemia: Secondary | ICD-10-CM | POA: Diagnosis not present

## 2023-05-02 DIAGNOSIS — D559 Anemia due to enzyme disorder, unspecified: Secondary | ICD-10-CM | POA: Diagnosis not present

## 2023-05-02 DIAGNOSIS — E7849 Other hyperlipidemia: Secondary | ICD-10-CM | POA: Diagnosis not present

## 2023-05-02 DIAGNOSIS — Z9183 Wandering in diseases classified elsewhere: Secondary | ICD-10-CM | POA: Diagnosis not present

## 2023-06-01 DIAGNOSIS — M6281 Muscle weakness (generalized): Secondary | ICD-10-CM | POA: Diagnosis not present

## 2023-06-01 DIAGNOSIS — R2681 Unsteadiness on feet: Secondary | ICD-10-CM | POA: Diagnosis not present

## 2023-06-04 DIAGNOSIS — M6281 Muscle weakness (generalized): Secondary | ICD-10-CM | POA: Diagnosis not present

## 2023-06-04 DIAGNOSIS — R2681 Unsteadiness on feet: Secondary | ICD-10-CM | POA: Diagnosis not present

## 2023-06-05 DIAGNOSIS — M6281 Muscle weakness (generalized): Secondary | ICD-10-CM | POA: Diagnosis not present

## 2023-06-05 DIAGNOSIS — R2681 Unsteadiness on feet: Secondary | ICD-10-CM | POA: Diagnosis not present

## 2023-06-06 DIAGNOSIS — R2681 Unsteadiness on feet: Secondary | ICD-10-CM | POA: Diagnosis not present

## 2023-06-06 DIAGNOSIS — M6281 Muscle weakness (generalized): Secondary | ICD-10-CM | POA: Diagnosis not present

## 2023-06-07 DIAGNOSIS — R2681 Unsteadiness on feet: Secondary | ICD-10-CM | POA: Diagnosis not present

## 2023-06-07 DIAGNOSIS — M6281 Muscle weakness (generalized): Secondary | ICD-10-CM | POA: Diagnosis not present

## 2023-06-12 DIAGNOSIS — K219 Gastro-esophageal reflux disease without esophagitis: Secondary | ICD-10-CM | POA: Diagnosis not present

## 2023-06-12 DIAGNOSIS — I10 Essential (primary) hypertension: Secondary | ICD-10-CM | POA: Diagnosis not present

## 2023-06-12 DIAGNOSIS — G4700 Insomnia, unspecified: Secondary | ICD-10-CM | POA: Diagnosis not present

## 2023-06-12 DIAGNOSIS — R2681 Unsteadiness on feet: Secondary | ICD-10-CM | POA: Diagnosis not present

## 2023-06-12 DIAGNOSIS — E785 Hyperlipidemia, unspecified: Secondary | ICD-10-CM | POA: Diagnosis not present

## 2023-06-12 DIAGNOSIS — J302 Other seasonal allergic rhinitis: Secondary | ICD-10-CM | POA: Diagnosis not present

## 2023-06-12 DIAGNOSIS — I2510 Atherosclerotic heart disease of native coronary artery without angina pectoris: Secondary | ICD-10-CM | POA: Diagnosis not present

## 2023-06-12 DIAGNOSIS — M6281 Muscle weakness (generalized): Secondary | ICD-10-CM | POA: Diagnosis not present

## 2023-06-12 DIAGNOSIS — E559 Vitamin D deficiency, unspecified: Secondary | ICD-10-CM | POA: Diagnosis not present

## 2023-06-13 DIAGNOSIS — M6281 Muscle weakness (generalized): Secondary | ICD-10-CM | POA: Diagnosis not present

## 2023-06-13 DIAGNOSIS — R2681 Unsteadiness on feet: Secondary | ICD-10-CM | POA: Diagnosis not present

## 2023-06-18 DIAGNOSIS — M6281 Muscle weakness (generalized): Secondary | ICD-10-CM | POA: Diagnosis not present

## 2023-06-18 DIAGNOSIS — R2681 Unsteadiness on feet: Secondary | ICD-10-CM | POA: Diagnosis not present

## 2023-06-19 DIAGNOSIS — M6281 Muscle weakness (generalized): Secondary | ICD-10-CM | POA: Diagnosis not present

## 2023-06-19 DIAGNOSIS — R2681 Unsteadiness on feet: Secondary | ICD-10-CM | POA: Diagnosis not present

## 2023-06-20 DIAGNOSIS — R2681 Unsteadiness on feet: Secondary | ICD-10-CM | POA: Diagnosis not present

## 2023-06-20 DIAGNOSIS — B351 Tinea unguium: Secondary | ICD-10-CM | POA: Diagnosis not present

## 2023-06-20 DIAGNOSIS — M2041 Other hammer toe(s) (acquired), right foot: Secondary | ICD-10-CM | POA: Diagnosis not present

## 2023-06-20 DIAGNOSIS — I7091 Generalized atherosclerosis: Secondary | ICD-10-CM | POA: Diagnosis not present

## 2023-06-20 DIAGNOSIS — M2042 Other hammer toe(s) (acquired), left foot: Secondary | ICD-10-CM | POA: Diagnosis not present

## 2023-06-20 DIAGNOSIS — M6281 Muscle weakness (generalized): Secondary | ICD-10-CM | POA: Diagnosis not present

## 2023-06-21 DIAGNOSIS — R2681 Unsteadiness on feet: Secondary | ICD-10-CM | POA: Diagnosis not present

## 2023-06-21 DIAGNOSIS — M6281 Muscle weakness (generalized): Secondary | ICD-10-CM | POA: Diagnosis not present

## 2023-06-22 DIAGNOSIS — M6281 Muscle weakness (generalized): Secondary | ICD-10-CM | POA: Diagnosis not present

## 2023-06-22 DIAGNOSIS — R2681 Unsteadiness on feet: Secondary | ICD-10-CM | POA: Diagnosis not present

## 2023-06-27 DIAGNOSIS — I2510 Atherosclerotic heart disease of native coronary artery without angina pectoris: Secondary | ICD-10-CM | POA: Diagnosis not present

## 2023-06-27 DIAGNOSIS — I10 Essential (primary) hypertension: Secondary | ICD-10-CM | POA: Diagnosis not present

## 2023-06-28 DIAGNOSIS — M6281 Muscle weakness (generalized): Secondary | ICD-10-CM | POA: Diagnosis not present

## 2023-06-28 DIAGNOSIS — R2681 Unsteadiness on feet: Secondary | ICD-10-CM | POA: Diagnosis not present

## 2023-06-29 DIAGNOSIS — R2681 Unsteadiness on feet: Secondary | ICD-10-CM | POA: Diagnosis not present

## 2023-06-29 DIAGNOSIS — M6281 Muscle weakness (generalized): Secondary | ICD-10-CM | POA: Diagnosis not present

## 2023-07-02 DIAGNOSIS — R2681 Unsteadiness on feet: Secondary | ICD-10-CM | POA: Diagnosis not present

## 2023-07-02 DIAGNOSIS — M6281 Muscle weakness (generalized): Secondary | ICD-10-CM | POA: Diagnosis not present

## 2023-07-03 DIAGNOSIS — R2681 Unsteadiness on feet: Secondary | ICD-10-CM | POA: Diagnosis not present

## 2023-07-03 DIAGNOSIS — M6281 Muscle weakness (generalized): Secondary | ICD-10-CM | POA: Diagnosis not present

## 2023-07-04 DIAGNOSIS — R2681 Unsteadiness on feet: Secondary | ICD-10-CM | POA: Diagnosis not present

## 2023-07-04 DIAGNOSIS — M6281 Muscle weakness (generalized): Secondary | ICD-10-CM | POA: Diagnosis not present

## 2023-07-05 DIAGNOSIS — M6281 Muscle weakness (generalized): Secondary | ICD-10-CM | POA: Diagnosis not present

## 2023-07-05 DIAGNOSIS — R2681 Unsteadiness on feet: Secondary | ICD-10-CM | POA: Diagnosis not present

## 2023-07-10 DIAGNOSIS — M6281 Muscle weakness (generalized): Secondary | ICD-10-CM | POA: Diagnosis not present

## 2023-07-10 DIAGNOSIS — R2681 Unsteadiness on feet: Secondary | ICD-10-CM | POA: Diagnosis not present

## 2023-07-10 DIAGNOSIS — G4700 Insomnia, unspecified: Secondary | ICD-10-CM | POA: Diagnosis not present

## 2023-07-10 DIAGNOSIS — K219 Gastro-esophageal reflux disease without esophagitis: Secondary | ICD-10-CM | POA: Diagnosis not present

## 2023-07-10 DIAGNOSIS — E785 Hyperlipidemia, unspecified: Secondary | ICD-10-CM | POA: Diagnosis not present

## 2023-07-11 DIAGNOSIS — R2681 Unsteadiness on feet: Secondary | ICD-10-CM | POA: Diagnosis not present

## 2023-07-11 DIAGNOSIS — M6281 Muscle weakness (generalized): Secondary | ICD-10-CM | POA: Diagnosis not present

## 2023-07-12 DIAGNOSIS — M6281 Muscle weakness (generalized): Secondary | ICD-10-CM | POA: Diagnosis not present

## 2023-07-12 DIAGNOSIS — R2681 Unsteadiness on feet: Secondary | ICD-10-CM | POA: Diagnosis not present

## 2023-07-13 DIAGNOSIS — M6281 Muscle weakness (generalized): Secondary | ICD-10-CM | POA: Diagnosis not present

## 2023-07-13 DIAGNOSIS — R2681 Unsteadiness on feet: Secondary | ICD-10-CM | POA: Diagnosis not present

## 2023-07-16 DIAGNOSIS — M6281 Muscle weakness (generalized): Secondary | ICD-10-CM | POA: Diagnosis not present

## 2023-07-16 DIAGNOSIS — R2681 Unsteadiness on feet: Secondary | ICD-10-CM | POA: Diagnosis not present

## 2023-07-17 DIAGNOSIS — R2681 Unsteadiness on feet: Secondary | ICD-10-CM | POA: Diagnosis not present

## 2023-07-17 DIAGNOSIS — M6281 Muscle weakness (generalized): Secondary | ICD-10-CM | POA: Diagnosis not present

## 2023-07-20 DIAGNOSIS — H5005 Alternating esotropia: Secondary | ICD-10-CM | POA: Diagnosis not present

## 2023-07-20 DIAGNOSIS — Z961 Presence of intraocular lens: Secondary | ICD-10-CM | POA: Diagnosis not present

## 2023-07-20 DIAGNOSIS — H524 Presbyopia: Secondary | ICD-10-CM | POA: Diagnosis not present

## 2023-07-25 DIAGNOSIS — J302 Other seasonal allergic rhinitis: Secondary | ICD-10-CM | POA: Diagnosis not present

## 2023-07-25 DIAGNOSIS — E559 Vitamin D deficiency, unspecified: Secondary | ICD-10-CM | POA: Diagnosis not present

## 2023-08-14 DIAGNOSIS — I10 Essential (primary) hypertension: Secondary | ICD-10-CM | POA: Diagnosis not present

## 2023-08-14 DIAGNOSIS — I2510 Atherosclerotic heart disease of native coronary artery without angina pectoris: Secondary | ICD-10-CM | POA: Diagnosis not present

## 2023-08-14 DIAGNOSIS — E785 Hyperlipidemia, unspecified: Secondary | ICD-10-CM | POA: Diagnosis not present

## 2023-08-20 DIAGNOSIS — K219 Gastro-esophageal reflux disease without esophagitis: Secondary | ICD-10-CM | POA: Diagnosis not present

## 2023-08-20 DIAGNOSIS — I10 Essential (primary) hypertension: Secondary | ICD-10-CM | POA: Diagnosis not present

## 2023-09-11 DIAGNOSIS — R262 Difficulty in walking, not elsewhere classified: Secondary | ICD-10-CM | POA: Diagnosis not present

## 2023-09-11 DIAGNOSIS — M25662 Stiffness of left knee, not elsewhere classified: Secondary | ICD-10-CM | POA: Diagnosis not present

## 2023-09-11 DIAGNOSIS — M6281 Muscle weakness (generalized): Secondary | ICD-10-CM | POA: Diagnosis not present

## 2023-09-11 DIAGNOSIS — M25562 Pain in left knee: Secondary | ICD-10-CM | POA: Diagnosis not present

## 2023-09-12 DIAGNOSIS — M25662 Stiffness of left knee, not elsewhere classified: Secondary | ICD-10-CM | POA: Diagnosis not present

## 2023-09-12 DIAGNOSIS — R262 Difficulty in walking, not elsewhere classified: Secondary | ICD-10-CM | POA: Diagnosis not present

## 2023-09-12 DIAGNOSIS — M25562 Pain in left knee: Secondary | ICD-10-CM | POA: Diagnosis not present

## 2023-09-12 DIAGNOSIS — M6281 Muscle weakness (generalized): Secondary | ICD-10-CM | POA: Diagnosis not present

## 2023-09-14 DIAGNOSIS — M6281 Muscle weakness (generalized): Secondary | ICD-10-CM | POA: Diagnosis not present

## 2023-09-14 DIAGNOSIS — M25562 Pain in left knee: Secondary | ICD-10-CM | POA: Diagnosis not present

## 2023-09-14 DIAGNOSIS — M25662 Stiffness of left knee, not elsewhere classified: Secondary | ICD-10-CM | POA: Diagnosis not present

## 2023-09-19 DIAGNOSIS — K219 Gastro-esophageal reflux disease without esophagitis: Secondary | ICD-10-CM | POA: Diagnosis not present

## 2023-09-19 DIAGNOSIS — G4700 Insomnia, unspecified: Secondary | ICD-10-CM | POA: Diagnosis not present

## 2023-09-19 DIAGNOSIS — E785 Hyperlipidemia, unspecified: Secondary | ICD-10-CM | POA: Diagnosis not present

## 2023-09-20 DIAGNOSIS — M6281 Muscle weakness (generalized): Secondary | ICD-10-CM | POA: Diagnosis not present

## 2023-09-20 DIAGNOSIS — M25662 Stiffness of left knee, not elsewhere classified: Secondary | ICD-10-CM | POA: Diagnosis not present

## 2023-09-20 DIAGNOSIS — M25562 Pain in left knee: Secondary | ICD-10-CM | POA: Diagnosis not present

## 2023-09-22 DIAGNOSIS — M25662 Stiffness of left knee, not elsewhere classified: Secondary | ICD-10-CM | POA: Diagnosis not present

## 2023-09-22 DIAGNOSIS — M25562 Pain in left knee: Secondary | ICD-10-CM | POA: Diagnosis not present

## 2023-09-22 DIAGNOSIS — M6281 Muscle weakness (generalized): Secondary | ICD-10-CM | POA: Diagnosis not present

## 2023-09-24 DIAGNOSIS — M6281 Muscle weakness (generalized): Secondary | ICD-10-CM | POA: Diagnosis not present

## 2023-09-24 DIAGNOSIS — M25662 Stiffness of left knee, not elsewhere classified: Secondary | ICD-10-CM | POA: Diagnosis not present

## 2023-09-24 DIAGNOSIS — M25562 Pain in left knee: Secondary | ICD-10-CM | POA: Diagnosis not present

## 2023-09-25 DIAGNOSIS — M6281 Muscle weakness (generalized): Secondary | ICD-10-CM | POA: Diagnosis not present

## 2023-09-25 DIAGNOSIS — M25562 Pain in left knee: Secondary | ICD-10-CM | POA: Diagnosis not present

## 2023-09-25 DIAGNOSIS — R262 Difficulty in walking, not elsewhere classified: Secondary | ICD-10-CM | POA: Diagnosis not present

## 2023-09-25 DIAGNOSIS — M25662 Stiffness of left knee, not elsewhere classified: Secondary | ICD-10-CM | POA: Diagnosis not present

## 2023-09-26 DIAGNOSIS — R262 Difficulty in walking, not elsewhere classified: Secondary | ICD-10-CM | POA: Diagnosis not present

## 2023-09-26 DIAGNOSIS — M25662 Stiffness of left knee, not elsewhere classified: Secondary | ICD-10-CM | POA: Diagnosis not present

## 2023-09-26 DIAGNOSIS — M25562 Pain in left knee: Secondary | ICD-10-CM | POA: Diagnosis not present

## 2023-09-26 DIAGNOSIS — M6281 Muscle weakness (generalized): Secondary | ICD-10-CM | POA: Diagnosis not present

## 2023-09-27 DIAGNOSIS — M25562 Pain in left knee: Secondary | ICD-10-CM | POA: Diagnosis not present

## 2023-09-27 DIAGNOSIS — M25662 Stiffness of left knee, not elsewhere classified: Secondary | ICD-10-CM | POA: Diagnosis not present

## 2023-09-27 DIAGNOSIS — M6281 Muscle weakness (generalized): Secondary | ICD-10-CM | POA: Diagnosis not present

## 2023-09-28 DIAGNOSIS — M25662 Stiffness of left knee, not elsewhere classified: Secondary | ICD-10-CM | POA: Diagnosis not present

## 2023-09-28 DIAGNOSIS — R262 Difficulty in walking, not elsewhere classified: Secondary | ICD-10-CM | POA: Diagnosis not present

## 2023-09-28 DIAGNOSIS — M6281 Muscle weakness (generalized): Secondary | ICD-10-CM | POA: Diagnosis not present

## 2023-09-28 DIAGNOSIS — M25562 Pain in left knee: Secondary | ICD-10-CM | POA: Diagnosis not present

## 2023-10-01 DIAGNOSIS — M6281 Muscle weakness (generalized): Secondary | ICD-10-CM | POA: Diagnosis not present

## 2023-10-01 DIAGNOSIS — M25562 Pain in left knee: Secondary | ICD-10-CM | POA: Diagnosis not present

## 2023-10-01 DIAGNOSIS — R262 Difficulty in walking, not elsewhere classified: Secondary | ICD-10-CM | POA: Diagnosis not present

## 2023-10-01 DIAGNOSIS — M25662 Stiffness of left knee, not elsewhere classified: Secondary | ICD-10-CM | POA: Diagnosis not present

## 2023-10-03 DIAGNOSIS — R262 Difficulty in walking, not elsewhere classified: Secondary | ICD-10-CM | POA: Diagnosis not present

## 2023-10-03 DIAGNOSIS — M6281 Muscle weakness (generalized): Secondary | ICD-10-CM | POA: Diagnosis not present

## 2023-10-03 DIAGNOSIS — M25662 Stiffness of left knee, not elsewhere classified: Secondary | ICD-10-CM | POA: Diagnosis not present

## 2023-10-03 DIAGNOSIS — M25562 Pain in left knee: Secondary | ICD-10-CM | POA: Diagnosis not present

## 2023-10-04 DIAGNOSIS — R262 Difficulty in walking, not elsewhere classified: Secondary | ICD-10-CM | POA: Diagnosis not present

## 2023-10-04 DIAGNOSIS — M25562 Pain in left knee: Secondary | ICD-10-CM | POA: Diagnosis not present

## 2023-10-04 DIAGNOSIS — M6281 Muscle weakness (generalized): Secondary | ICD-10-CM | POA: Diagnosis not present

## 2023-10-04 DIAGNOSIS — M25662 Stiffness of left knee, not elsewhere classified: Secondary | ICD-10-CM | POA: Diagnosis not present

## 2023-10-09 DIAGNOSIS — R262 Difficulty in walking, not elsewhere classified: Secondary | ICD-10-CM | POA: Diagnosis not present

## 2023-10-09 DIAGNOSIS — M25662 Stiffness of left knee, not elsewhere classified: Secondary | ICD-10-CM | POA: Diagnosis not present

## 2023-10-09 DIAGNOSIS — M6281 Muscle weakness (generalized): Secondary | ICD-10-CM | POA: Diagnosis not present

## 2023-10-09 DIAGNOSIS — M25562 Pain in left knee: Secondary | ICD-10-CM | POA: Diagnosis not present

## 2023-10-11 DIAGNOSIS — M25562 Pain in left knee: Secondary | ICD-10-CM | POA: Diagnosis not present

## 2023-10-11 DIAGNOSIS — M25662 Stiffness of left knee, not elsewhere classified: Secondary | ICD-10-CM | POA: Diagnosis not present

## 2023-10-11 DIAGNOSIS — R262 Difficulty in walking, not elsewhere classified: Secondary | ICD-10-CM | POA: Diagnosis not present

## 2023-10-11 DIAGNOSIS — M6281 Muscle weakness (generalized): Secondary | ICD-10-CM | POA: Diagnosis not present

## 2023-10-12 DIAGNOSIS — M25662 Stiffness of left knee, not elsewhere classified: Secondary | ICD-10-CM | POA: Diagnosis not present

## 2023-10-12 DIAGNOSIS — G4700 Insomnia, unspecified: Secondary | ICD-10-CM | POA: Diagnosis not present

## 2023-10-12 DIAGNOSIS — M6281 Muscle weakness (generalized): Secondary | ICD-10-CM | POA: Diagnosis not present

## 2023-10-12 DIAGNOSIS — R262 Difficulty in walking, not elsewhere classified: Secondary | ICD-10-CM | POA: Diagnosis not present

## 2023-10-12 DIAGNOSIS — I10 Essential (primary) hypertension: Secondary | ICD-10-CM | POA: Diagnosis not present

## 2023-10-12 DIAGNOSIS — I2510 Atherosclerotic heart disease of native coronary artery without angina pectoris: Secondary | ICD-10-CM | POA: Diagnosis not present

## 2023-10-12 DIAGNOSIS — M25562 Pain in left knee: Secondary | ICD-10-CM | POA: Diagnosis not present

## 2023-10-12 DIAGNOSIS — K219 Gastro-esophageal reflux disease without esophagitis: Secondary | ICD-10-CM | POA: Diagnosis not present

## 2023-10-15 DIAGNOSIS — M25662 Stiffness of left knee, not elsewhere classified: Secondary | ICD-10-CM | POA: Diagnosis not present

## 2023-10-15 DIAGNOSIS — M25562 Pain in left knee: Secondary | ICD-10-CM | POA: Diagnosis not present

## 2023-10-15 DIAGNOSIS — M6281 Muscle weakness (generalized): Secondary | ICD-10-CM | POA: Diagnosis not present

## 2023-10-15 DIAGNOSIS — R262 Difficulty in walking, not elsewhere classified: Secondary | ICD-10-CM | POA: Diagnosis not present

## 2023-10-15 DIAGNOSIS — B351 Tinea unguium: Secondary | ICD-10-CM | POA: Diagnosis not present

## 2023-10-16 DIAGNOSIS — M6281 Muscle weakness (generalized): Secondary | ICD-10-CM | POA: Diagnosis not present

## 2023-10-16 DIAGNOSIS — R262 Difficulty in walking, not elsewhere classified: Secondary | ICD-10-CM | POA: Diagnosis not present

## 2023-10-16 DIAGNOSIS — M25562 Pain in left knee: Secondary | ICD-10-CM | POA: Diagnosis not present

## 2023-10-16 DIAGNOSIS — M25662 Stiffness of left knee, not elsewhere classified: Secondary | ICD-10-CM | POA: Diagnosis not present

## 2023-10-17 DIAGNOSIS — M25662 Stiffness of left knee, not elsewhere classified: Secondary | ICD-10-CM | POA: Diagnosis not present

## 2023-10-17 DIAGNOSIS — M6281 Muscle weakness (generalized): Secondary | ICD-10-CM | POA: Diagnosis not present

## 2023-10-17 DIAGNOSIS — R262 Difficulty in walking, not elsewhere classified: Secondary | ICD-10-CM | POA: Diagnosis not present

## 2023-10-17 DIAGNOSIS — M25562 Pain in left knee: Secondary | ICD-10-CM | POA: Diagnosis not present

## 2023-11-18 DIAGNOSIS — Z9842 Cataract extraction status, left eye: Secondary | ICD-10-CM | POA: Diagnosis not present

## 2023-11-18 DIAGNOSIS — I469 Cardiac arrest, cause unspecified: Secondary | ICD-10-CM | POA: Diagnosis not present

## 2023-11-18 DIAGNOSIS — S0990XA Unspecified injury of head, initial encounter: Secondary | ICD-10-CM | POA: Diagnosis not present

## 2023-11-18 DIAGNOSIS — Z9089 Acquired absence of other organs: Secondary | ICD-10-CM | POA: Diagnosis not present

## 2023-11-18 DIAGNOSIS — Z888 Allergy status to other drugs, medicaments and biological substances status: Secondary | ICD-10-CM | POA: Diagnosis not present

## 2023-11-18 DIAGNOSIS — K219 Gastro-esophageal reflux disease without esophagitis: Secondary | ICD-10-CM | POA: Diagnosis not present

## 2023-11-18 DIAGNOSIS — M96A2 Fracture of one rib associated with chest compression and cardiopulmonary resuscitation: Secondary | ICD-10-CM | POA: Diagnosis not present

## 2023-11-18 DIAGNOSIS — Z7982 Long term (current) use of aspirin: Secondary | ICD-10-CM | POA: Diagnosis not present

## 2023-11-18 DIAGNOSIS — Z87891 Personal history of nicotine dependence: Secondary | ICD-10-CM | POA: Diagnosis not present

## 2023-11-18 DIAGNOSIS — Z955 Presence of coronary angioplasty implant and graft: Secondary | ICD-10-CM | POA: Diagnosis not present

## 2023-11-18 DIAGNOSIS — R001 Bradycardia, unspecified: Secondary | ICD-10-CM | POA: Diagnosis not present

## 2023-11-18 DIAGNOSIS — S2232XA Fracture of one rib, left side, initial encounter for closed fracture: Secondary | ICD-10-CM | POA: Diagnosis not present

## 2023-11-18 DIAGNOSIS — Z886 Allergy status to analgesic agent status: Secondary | ICD-10-CM | POA: Diagnosis not present

## 2023-11-18 DIAGNOSIS — Z885 Allergy status to narcotic agent status: Secondary | ICD-10-CM | POA: Diagnosis not present

## 2023-11-18 DIAGNOSIS — I2510 Atherosclerotic heart disease of native coronary artery without angina pectoris: Secondary | ICD-10-CM | POA: Diagnosis not present

## 2023-11-18 DIAGNOSIS — K589 Irritable bowel syndrome without diarrhea: Secondary | ICD-10-CM | POA: Diagnosis not present

## 2023-11-18 DIAGNOSIS — E785 Hyperlipidemia, unspecified: Secondary | ICD-10-CM | POA: Diagnosis not present

## 2023-11-18 DIAGNOSIS — I4430 Unspecified atrioventricular block: Secondary | ICD-10-CM | POA: Diagnosis not present

## 2023-11-18 DIAGNOSIS — I10 Essential (primary) hypertension: Secondary | ICD-10-CM | POA: Diagnosis not present

## 2023-11-18 DIAGNOSIS — R918 Other nonspecific abnormal finding of lung field: Secondary | ICD-10-CM | POA: Diagnosis not present

## 2023-11-18 DIAGNOSIS — R55 Syncope and collapse: Secondary | ICD-10-CM | POA: Diagnosis not present

## 2023-11-18 DIAGNOSIS — I6523 Occlusion and stenosis of bilateral carotid arteries: Secondary | ICD-10-CM | POA: Diagnosis not present

## 2023-11-18 DIAGNOSIS — Z9841 Cataract extraction status, right eye: Secondary | ICD-10-CM | POA: Diagnosis not present

## 2023-11-18 DIAGNOSIS — S098XXA Other specified injuries of head, initial encounter: Secondary | ICD-10-CM | POA: Diagnosis not present

## 2023-11-18 DIAGNOSIS — J984 Other disorders of lung: Secondary | ICD-10-CM | POA: Diagnosis not present

## 2023-11-18 DIAGNOSIS — W19XXXA Unspecified fall, initial encounter: Secondary | ICD-10-CM | POA: Diagnosis not present

## 2023-11-18 DIAGNOSIS — Z79899 Other long term (current) drug therapy: Secondary | ICD-10-CM | POA: Diagnosis not present

## 2023-11-18 DIAGNOSIS — I340 Nonrheumatic mitral (valve) insufficiency: Secondary | ICD-10-CM | POA: Diagnosis not present

## 2023-11-19 DIAGNOSIS — E785 Hyperlipidemia, unspecified: Secondary | ICD-10-CM | POA: Diagnosis not present

## 2023-11-19 DIAGNOSIS — I10 Essential (primary) hypertension: Secondary | ICD-10-CM | POA: Diagnosis not present

## 2023-11-19 DIAGNOSIS — R55 Syncope and collapse: Secondary | ICD-10-CM | POA: Diagnosis not present

## 2023-11-19 DIAGNOSIS — I2510 Atherosclerotic heart disease of native coronary artery without angina pectoris: Secondary | ICD-10-CM | POA: Diagnosis not present

## 2023-11-19 DIAGNOSIS — I340 Nonrheumatic mitral (valve) insufficiency: Secondary | ICD-10-CM | POA: Diagnosis not present

## 2023-11-20 DIAGNOSIS — E785 Hyperlipidemia, unspecified: Secondary | ICD-10-CM | POA: Diagnosis not present

## 2023-11-20 DIAGNOSIS — J302 Other seasonal allergic rhinitis: Secondary | ICD-10-CM | POA: Diagnosis not present

## 2023-11-20 DIAGNOSIS — S2232XA Fracture of one rib, left side, initial encounter for closed fracture: Secondary | ICD-10-CM | POA: Diagnosis not present

## 2023-11-20 DIAGNOSIS — R55 Syncope and collapse: Secondary | ICD-10-CM | POA: Diagnosis not present

## 2023-11-20 DIAGNOSIS — I2510 Atherosclerotic heart disease of native coronary artery without angina pectoris: Secondary | ICD-10-CM | POA: Diagnosis not present

## 2023-11-20 DIAGNOSIS — I10 Essential (primary) hypertension: Secondary | ICD-10-CM | POA: Diagnosis not present

## 2023-11-20 DIAGNOSIS — G4700 Insomnia, unspecified: Secondary | ICD-10-CM | POA: Diagnosis not present

## 2023-11-20 DIAGNOSIS — K219 Gastro-esophageal reflux disease without esophagitis: Secondary | ICD-10-CM | POA: Diagnosis not present

## 2023-11-20 DIAGNOSIS — Z9181 History of falling: Secondary | ICD-10-CM | POA: Diagnosis not present

## 2023-11-20 DIAGNOSIS — E559 Vitamin D deficiency, unspecified: Secondary | ICD-10-CM | POA: Diagnosis not present

## 2023-11-22 DIAGNOSIS — I10 Essential (primary) hypertension: Secondary | ICD-10-CM | POA: Diagnosis not present

## 2023-11-22 DIAGNOSIS — S098XXA Other specified injuries of head, initial encounter: Secondary | ICD-10-CM | POA: Diagnosis not present

## 2023-11-22 DIAGNOSIS — E7800 Pure hypercholesterolemia, unspecified: Secondary | ICD-10-CM | POA: Diagnosis not present

## 2023-11-22 DIAGNOSIS — Z885 Allergy status to narcotic agent status: Secondary | ICD-10-CM | POA: Diagnosis not present

## 2023-11-22 DIAGNOSIS — J029 Acute pharyngitis, unspecified: Secondary | ICD-10-CM | POA: Diagnosis not present

## 2023-11-22 DIAGNOSIS — Z79899 Other long term (current) drug therapy: Secondary | ICD-10-CM | POA: Diagnosis not present

## 2023-11-22 DIAGNOSIS — R42 Dizziness and giddiness: Secondary | ICD-10-CM | POA: Diagnosis not present

## 2023-11-22 DIAGNOSIS — Z7982 Long term (current) use of aspirin: Secondary | ICD-10-CM | POA: Diagnosis not present

## 2023-11-24 DIAGNOSIS — I509 Heart failure, unspecified: Secondary | ICD-10-CM | POA: Diagnosis not present

## 2023-11-24 DIAGNOSIS — R55 Syncope and collapse: Secondary | ICD-10-CM | POA: Diagnosis not present

## 2023-11-24 DIAGNOSIS — I2510 Atherosclerotic heart disease of native coronary artery without angina pectoris: Secondary | ICD-10-CM | POA: Diagnosis not present

## 2023-11-26 DIAGNOSIS — R42 Dizziness and giddiness: Secondary | ICD-10-CM | POA: Diagnosis not present

## 2023-11-26 DIAGNOSIS — R0789 Other chest pain: Secondary | ICD-10-CM | POA: Diagnosis not present

## 2023-11-26 DIAGNOSIS — I2510 Atherosclerotic heart disease of native coronary artery without angina pectoris: Secondary | ICD-10-CM | POA: Diagnosis not present
